# Patient Record
Sex: Female | Born: 1980 | Race: Black or African American | Hispanic: No | Marital: Married | Smoking: Never smoker
Health system: Southern US, Community
[De-identification: ages and names within clinical notes are randomized; demographics above are authoritative.]

## PROBLEM LIST (undated history)

## (undated) ENCOUNTER — Inpatient Hospital Stay (HOSPITAL_COMMUNITY): Payer: Self-pay

## (undated) DIAGNOSIS — F32A Depression, unspecified: Secondary | ICD-10-CM

## (undated) DIAGNOSIS — D649 Anemia, unspecified: Secondary | ICD-10-CM

## (undated) DIAGNOSIS — K219 Gastro-esophageal reflux disease without esophagitis: Secondary | ICD-10-CM

## (undated) DIAGNOSIS — E669 Obesity, unspecified: Secondary | ICD-10-CM

## (undated) DIAGNOSIS — R51 Headache: Secondary | ICD-10-CM

## (undated) DIAGNOSIS — F329 Major depressive disorder, single episode, unspecified: Secondary | ICD-10-CM

---
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
## 2000-04-01 ENCOUNTER — Emergency Department (HOSPITAL_COMMUNITY): Admission: EM | Admit: 2000-04-01 | Discharge: 2000-04-02 | Payer: Self-pay | Admitting: Emergency Medicine

## 2000-04-02 ENCOUNTER — Encounter: Payer: Self-pay | Admitting: Emergency Medicine

## 2000-11-17 ENCOUNTER — Observation Stay (HOSPITAL_COMMUNITY): Admission: AD | Admit: 2000-11-17 | Discharge: 2000-11-18 | Payer: Self-pay | Admitting: Obstetrics and Gynecology

## 2000-11-21 ENCOUNTER — Inpatient Hospital Stay (HOSPITAL_COMMUNITY): Admission: AD | Admit: 2000-11-21 | Discharge: 2000-11-25 | Payer: Self-pay | Admitting: Obstetrics and Gynecology

## 2001-03-08 ENCOUNTER — Encounter: Payer: Self-pay | Admitting: Obstetrics and Gynecology

## 2001-03-08 ENCOUNTER — Ambulatory Visit (HOSPITAL_COMMUNITY): Admission: RE | Admit: 2001-03-08 | Discharge: 2001-03-08 | Payer: Self-pay | Admitting: Obstetrics and Gynecology

## 2001-09-04 ENCOUNTER — Other Ambulatory Visit: Admission: RE | Admit: 2001-09-04 | Discharge: 2001-09-04 | Payer: Self-pay | Admitting: Obstetrics and Gynecology

## 2001-11-01 ENCOUNTER — Emergency Department (HOSPITAL_COMMUNITY): Admission: EM | Admit: 2001-11-01 | Discharge: 2001-11-01 | Payer: Self-pay | Admitting: Emergency Medicine

## 2002-01-28 ENCOUNTER — Emergency Department (HOSPITAL_COMMUNITY): Admission: EM | Admit: 2002-01-28 | Discharge: 2002-01-28 | Payer: Self-pay | Admitting: Emergency Medicine

## 2002-01-29 ENCOUNTER — Emergency Department (HOSPITAL_COMMUNITY): Admission: EM | Admit: 2002-01-29 | Discharge: 2002-01-29 | Payer: Self-pay | Admitting: *Deleted

## 2002-04-11 ENCOUNTER — Emergency Department (HOSPITAL_COMMUNITY): Admission: EM | Admit: 2002-04-11 | Discharge: 2002-04-11 | Payer: Self-pay | Admitting: Emergency Medicine

## 2002-08-30 ENCOUNTER — Emergency Department (HOSPITAL_COMMUNITY): Admission: EM | Admit: 2002-08-30 | Discharge: 2002-08-30 | Payer: Self-pay | Admitting: Emergency Medicine

## 2002-11-03 ENCOUNTER — Emergency Department (HOSPITAL_COMMUNITY): Admission: EM | Admit: 2002-11-03 | Discharge: 2002-11-03 | Payer: Self-pay | Admitting: Emergency Medicine

## 2002-11-27 ENCOUNTER — Emergency Department (HOSPITAL_COMMUNITY): Admission: EM | Admit: 2002-11-27 | Discharge: 2002-11-27 | Payer: Self-pay | Admitting: Emergency Medicine

## 2003-12-12 ENCOUNTER — Other Ambulatory Visit: Admission: RE | Admit: 2003-12-12 | Discharge: 2003-12-12 | Payer: Self-pay | Admitting: Obstetrics and Gynecology

## 2004-11-29 ENCOUNTER — Emergency Department (HOSPITAL_COMMUNITY): Admission: EM | Admit: 2004-11-29 | Discharge: 2004-11-29 | Payer: Self-pay | Admitting: Emergency Medicine

## 2005-05-09 ENCOUNTER — Emergency Department (HOSPITAL_COMMUNITY): Admission: EM | Admit: 2005-05-09 | Discharge: 2005-05-09 | Payer: Self-pay | Admitting: Emergency Medicine

## 2005-07-29 ENCOUNTER — Ambulatory Visit (HOSPITAL_COMMUNITY): Admission: RE | Admit: 2005-07-29 | Discharge: 2005-07-29 | Payer: Self-pay | Admitting: Obstetrics

## 2005-08-14 ENCOUNTER — Emergency Department (HOSPITAL_COMMUNITY): Admission: EM | Admit: 2005-08-14 | Discharge: 2005-08-14 | Payer: Self-pay | Admitting: Emergency Medicine

## 2005-09-28 ENCOUNTER — Emergency Department (HOSPITAL_COMMUNITY): Admission: EM | Admit: 2005-09-28 | Discharge: 2005-09-28 | Payer: Self-pay | Admitting: Emergency Medicine

## 2005-11-22 ENCOUNTER — Emergency Department (HOSPITAL_COMMUNITY): Admission: EM | Admit: 2005-11-22 | Discharge: 2005-11-22 | Payer: Self-pay | Admitting: Emergency Medicine

## 2005-12-12 ENCOUNTER — Encounter: Admission: RE | Admit: 2005-12-12 | Discharge: 2005-12-22 | Payer: Self-pay | Admitting: Obstetrics and Gynecology

## 2006-06-12 ENCOUNTER — Emergency Department (HOSPITAL_COMMUNITY): Admission: EM | Admit: 2006-06-12 | Discharge: 2006-06-12 | Payer: Self-pay | Admitting: Emergency Medicine

## 2006-08-27 ENCOUNTER — Emergency Department (HOSPITAL_COMMUNITY): Admission: EM | Admit: 2006-08-27 | Discharge: 2006-08-27 | Payer: Self-pay | Admitting: Emergency Medicine

## 2006-09-02 ENCOUNTER — Emergency Department (HOSPITAL_COMMUNITY): Admission: EM | Admit: 2006-09-02 | Discharge: 2006-09-03 | Payer: Self-pay | Admitting: Emergency Medicine

## 2006-12-05 ENCOUNTER — Emergency Department (HOSPITAL_COMMUNITY): Admission: EM | Admit: 2006-12-05 | Discharge: 2006-12-05 | Payer: Self-pay | Admitting: Emergency Medicine

## 2006-12-07 ENCOUNTER — Emergency Department (HOSPITAL_COMMUNITY): Admission: EM | Admit: 2006-12-07 | Discharge: 2006-12-07 | Payer: Self-pay | Admitting: Emergency Medicine

## 2007-03-05 ENCOUNTER — Emergency Department (HOSPITAL_COMMUNITY): Admission: EM | Admit: 2007-03-05 | Discharge: 2007-03-05 | Payer: Self-pay | Admitting: Emergency Medicine

## 2007-10-08 ENCOUNTER — Emergency Department (HOSPITAL_COMMUNITY): Admission: EM | Admit: 2007-10-08 | Discharge: 2007-10-08 | Payer: Self-pay | Admitting: Emergency Medicine

## 2007-10-12 ENCOUNTER — Emergency Department (HOSPITAL_COMMUNITY): Admission: EM | Admit: 2007-10-12 | Discharge: 2007-10-12 | Payer: Self-pay | Admitting: Emergency Medicine

## 2008-04-09 ENCOUNTER — Emergency Department (HOSPITAL_COMMUNITY): Admission: EM | Admit: 2008-04-09 | Discharge: 2008-04-09 | Payer: Self-pay | Admitting: Emergency Medicine

## 2008-04-20 ENCOUNTER — Emergency Department (HOSPITAL_COMMUNITY): Admission: EM | Admit: 2008-04-20 | Discharge: 2008-04-20 | Payer: Self-pay | Admitting: Family Medicine

## 2008-09-20 ENCOUNTER — Emergency Department (HOSPITAL_COMMUNITY): Admission: EM | Admit: 2008-09-20 | Discharge: 2008-09-20 | Payer: Self-pay | Admitting: Family Medicine

## 2008-10-30 ENCOUNTER — Emergency Department (HOSPITAL_COMMUNITY): Admission: EM | Admit: 2008-10-30 | Discharge: 2008-10-30 | Payer: Self-pay | Admitting: Family Medicine

## 2009-01-16 ENCOUNTER — Emergency Department (HOSPITAL_COMMUNITY): Admission: EM | Admit: 2009-01-16 | Discharge: 2009-01-16 | Payer: Self-pay | Admitting: Emergency Medicine

## 2009-06-03 ENCOUNTER — Emergency Department (HOSPITAL_COMMUNITY): Admission: EM | Admit: 2009-06-03 | Discharge: 2009-06-03 | Payer: Self-pay | Admitting: Family Medicine

## 2009-06-29 ENCOUNTER — Emergency Department (HOSPITAL_COMMUNITY): Admission: EM | Admit: 2009-06-29 | Discharge: 2009-06-29 | Payer: Self-pay | Admitting: Emergency Medicine

## 2010-02-09 ENCOUNTER — Emergency Department (HOSPITAL_COMMUNITY): Admission: EM | Admit: 2010-02-09 | Discharge: 2010-02-09 | Payer: Self-pay | Admitting: Emergency Medicine

## 2010-03-30 ENCOUNTER — Emergency Department (HOSPITAL_COMMUNITY): Admission: EM | Admit: 2010-03-30 | Discharge: 2010-03-30 | Payer: Self-pay | Admitting: Emergency Medicine

## 2010-10-07 LAB — DIFFERENTIAL
Lymphocytes Relative: 29 % (ref 12–46)
Lymphs Abs: 1.8 10*3/uL (ref 0.7–4.0)
Monocytes Absolute: 0.6 10*3/uL (ref 0.1–1.0)
Monocytes Relative: 9 % (ref 3–12)
Neutrophils Relative %: 55 % (ref 43–77)

## 2010-10-07 LAB — URINALYSIS, ROUTINE W REFLEX MICROSCOPIC
Glucose, UA: NEGATIVE mg/dL
Nitrite: NEGATIVE
Protein, ur: NEGATIVE mg/dL

## 2010-10-07 LAB — CBC
HCT: 37.3 % (ref 36.0–46.0)
Hemoglobin: 11.7 g/dL — ABNORMAL LOW (ref 12.0–15.0)
MCH: 25.1 pg — ABNORMAL LOW (ref 26.0–34.0)
RBC: 4.66 MIL/uL (ref 3.87–5.11)
RDW: 15.6 % — ABNORMAL HIGH (ref 11.5–15.5)

## 2010-10-07 LAB — GC/CHLAMYDIA PROBE AMP, GENITAL: Chlamydia, DNA Probe: NEGATIVE

## 2010-10-07 LAB — WET PREP, GENITAL
Trich, Wet Prep: NONE SEEN
Yeast Wet Prep HPF POC: NONE SEEN

## 2010-10-07 LAB — BASIC METABOLIC PANEL
Calcium: 8.9 mg/dL (ref 8.4–10.5)
GFR calc Af Amer: >60 mL/min (ref >60)
GFR calc non Af Amer: >60 mL/min (ref >60)
Sodium: 135 mEq/L (ref 135–145)

## 2010-10-25 ENCOUNTER — Emergency Department (HOSPITAL_COMMUNITY)
Admission: EM | Admit: 2010-10-25 | Discharge: 2010-10-25 | Disposition: A | Discharge status: Home / Self Care | Payer: Self-pay | Attending: Emergency Medicine | Admitting: Emergency Medicine

## 2010-10-25 DIAGNOSIS — B9689 Other specified bacterial agents as the cause of diseases classified elsewhere: Secondary | ICD-10-CM | POA: Insufficient documentation

## 2010-10-25 DIAGNOSIS — N760 Acute vaginitis: Secondary | ICD-10-CM | POA: Insufficient documentation

## 2010-10-25 DIAGNOSIS — K644 Residual hemorrhoidal skin tags: Secondary | ICD-10-CM | POA: Insufficient documentation

## 2010-10-25 DIAGNOSIS — A499 Bacterial infection, unspecified: Secondary | ICD-10-CM | POA: Insufficient documentation

## 2010-10-25 DIAGNOSIS — K625 Hemorrhage of anus and rectum: Secondary | ICD-10-CM | POA: Insufficient documentation

## 2010-10-25 DIAGNOSIS — R109 Unspecified abdominal pain: Secondary | ICD-10-CM | POA: Insufficient documentation

## 2010-10-25 LAB — URINALYSIS, ROUTINE W REFLEX MICROSCOPIC
Bilirubin Urine: NEGATIVE
Glucose, UA: NEGATIVE mg/dL
Hgb urine dipstick: NEGATIVE
Nitrite: NEGATIVE
Protein, ur: NEGATIVE mg/dL
Urobilinogen, UA: 0.2 mg/dL (ref 0.0–1.0)
pH: 7 (ref 5.0–8.0)

## 2010-10-25 LAB — WET PREP, GENITAL: Trich, Wet Prep: NONE SEEN

## 2010-10-25 LAB — OCCULT BLOOD, POC DEVICE: Fecal Occult Bld: POSITIVE

## 2010-10-26 ENCOUNTER — Emergency Department (HOSPITAL_COMMUNITY)
Admission: EM | Admit: 2010-10-26 | Discharge: 2010-10-26 | Disposition: A | Discharge status: Home / Self Care | Payer: Self-pay | Attending: Emergency Medicine | Admitting: Emergency Medicine

## 2010-10-26 DIAGNOSIS — R10815 Periumbilic abdominal tenderness: Secondary | ICD-10-CM | POA: Insufficient documentation

## 2010-10-26 DIAGNOSIS — R109 Unspecified abdominal pain: Secondary | ICD-10-CM | POA: Insufficient documentation

## 2010-10-26 DIAGNOSIS — R110 Nausea: Secondary | ICD-10-CM | POA: Insufficient documentation

## 2010-10-26 LAB — CBC
HCT: 38.2 % (ref 36.0–46.0)
Hemoglobin: 11.6 g/dL — ABNORMAL LOW (ref 12.0–15.0)
MCHC: 30.4 g/dL (ref 30.0–36.0)
MCV: 80.8 fL (ref 78.0–100.0)
Platelets: 331 10*3/uL (ref 150–400)
RBC: 4.73 MIL/uL (ref 3.87–5.11)
WBC: 6.5 10*3/uL (ref 4.0–10.5)

## 2010-10-26 LAB — POCT URINALYSIS DIP (DEVICE): pH: 6.5 (ref 5.0–8.0)

## 2010-10-26 LAB — COMPREHENSIVE METABOLIC PANEL
ALT: 23 U/L (ref 0–35)
AST: 18 U/L (ref 0–37)
Alkaline Phosphatase: 84 U/L (ref 39–117)
CO2: 29 mEq/L (ref 19–32)
Calcium: 9.1 mg/dL (ref 8.4–10.5)
Chloride: 104 mEq/L (ref 96–112)
GFR calc non Af Amer: >60 mL/min (ref >60)
Total Bilirubin: 0.8 mg/dL (ref 0.3–1.2)

## 2010-10-26 LAB — WET PREP, GENITAL
Clue Cells Wet Prep HPF POC: NONE SEEN
Yeast Wet Prep HPF POC: NONE SEEN

## 2010-10-26 LAB — DIFFERENTIAL
Basophils Relative: 1 % (ref 0–1)
Eosinophils Absolute: 0.4 10*3/uL (ref 0.0–0.7)
Neutrophils Relative %: 57 % (ref 43–77)

## 2010-10-26 LAB — GC/CHLAMYDIA PROBE AMP, GENITAL: GC Probe Amp, Genital: NEGATIVE

## 2010-11-01 LAB — URINALYSIS, ROUTINE W REFLEX MICROSCOPIC
Bilirubin Urine: NEGATIVE
Ketones, ur: NEGATIVE mg/dL
Nitrite: NEGATIVE
Protein, ur: NEGATIVE mg/dL
pH: 6.5 (ref 5.0–8.0)

## 2010-11-01 LAB — DIFFERENTIAL
Basophils Absolute: 0.1 10*3/uL (ref 0.0–0.1)
Eosinophils Absolute: 0.4 10*3/uL (ref 0.0–0.7)
Eosinophils Relative: 7 % — ABNORMAL HIGH (ref 0–5)
Lymphocytes Relative: 23 % (ref 12–46)
Monocytes Absolute: 0.5 10*3/uL (ref 0.1–1.0)

## 2010-11-01 LAB — COMPREHENSIVE METABOLIC PANEL
Albumin: 3.6 g/dL (ref 3.5–5.2)
Alkaline Phosphatase: 64 U/L (ref 39–117)
CO2: 24 mEq/L (ref 19–32)
Calcium: 8.8 mg/dL (ref 8.4–10.5)
GFR calc Af Amer: >60 mL/min (ref >60)
Glucose, Bld: 100 mg/dL — ABNORMAL HIGH (ref 70–99)
Total Protein: 7.5 g/dL (ref 6.0–8.3)

## 2010-11-01 LAB — CBC
MCHC: 33 g/dL (ref 30.0–36.0)
MCV: 78.3 fL (ref 78.0–100.0)

## 2010-11-03 LAB — POCT URINALYSIS DIP (DEVICE)
Bilirubin Urine: NEGATIVE
Glucose, UA: NEGATIVE mg/dL
Hgb urine dipstick: NEGATIVE
Specific Gravity, Urine: 1.02 (ref 1.005–1.030)
Urobilinogen, UA: 0.2 mg/dL (ref 0.0–1.0)
pH: 6.5 (ref 5.0–8.0)

## 2010-11-03 LAB — GC/CHLAMYDIA PROBE AMP, GENITAL
Chlamydia, DNA Probe: NEGATIVE
GC Probe Amp, Genital: NEGATIVE

## 2010-11-03 LAB — WET PREP, GENITAL: Trich, Wet Prep: NONE SEEN

## 2010-11-09 LAB — POCT URINALYSIS DIP (DEVICE)
Nitrite: NEGATIVE
Protein, ur: 30 mg/dL — AB
pH: 6.5 (ref 5.0–8.0)

## 2011-03-09 ENCOUNTER — Emergency Department (HOSPITAL_COMMUNITY): Payer: Self-pay

## 2011-03-09 ENCOUNTER — Emergency Department (HOSPITAL_COMMUNITY)
Admission: EM | Admit: 2011-03-09 | Discharge: 2011-03-09 | Disposition: A | Discharge status: Home / Self Care | Payer: Self-pay | Attending: Emergency Medicine | Admitting: Emergency Medicine

## 2011-03-09 DIAGNOSIS — R10814 Left lower quadrant abdominal tenderness: Secondary | ICD-10-CM | POA: Insufficient documentation

## 2011-03-09 DIAGNOSIS — R109 Unspecified abdominal pain: Secondary | ICD-10-CM | POA: Insufficient documentation

## 2011-03-09 DIAGNOSIS — N949 Unspecified condition associated with female genital organs and menstrual cycle: Secondary | ICD-10-CM | POA: Insufficient documentation

## 2011-03-09 LAB — WET PREP, GENITAL
Trich, Wet Prep: NONE SEEN
Yeast Wet Prep HPF POC: NONE SEEN

## 2011-03-09 LAB — URINALYSIS, ROUTINE W REFLEX MICROSCOPIC
Bilirubin Urine: NEGATIVE
Ketones, ur: NEGATIVE mg/dL
Nitrite: NEGATIVE
Urobilinogen, UA: 0.2 mg/dL (ref 0.0–1.0)

## 2011-03-09 LAB — POCT PREGNANCY, URINE: Preg Test, Ur: NEGATIVE

## 2011-05-02 ENCOUNTER — Inpatient Hospital Stay (INDEPENDENT_AMBULATORY_CARE_PROVIDER_SITE_OTHER)
Admission: RE | Admit: 2011-05-02 | Discharge: 2011-05-02 | Disposition: A | Discharge status: Home / Self Care | Payer: Self-pay | Source: Ambulatory Visit | Attending: Family Medicine | Admitting: Family Medicine

## 2011-05-02 DIAGNOSIS — K089 Disorder of teeth and supporting structures, unspecified: Secondary | ICD-10-CM

## 2011-09-14 ENCOUNTER — Emergency Department (HOSPITAL_COMMUNITY)
Admission: EM | Admit: 2011-09-14 | Discharge: 2011-09-14 | Disposition: A | Discharge status: Home / Self Care | Payer: BC Managed Care – PPO | Source: Home / Self Care

## 2011-09-14 ENCOUNTER — Encounter (HOSPITAL_COMMUNITY): Payer: Self-pay | Admitting: *Deleted

## 2011-09-14 DIAGNOSIS — B9789 Other viral agents as the cause of diseases classified elsewhere: Secondary | ICD-10-CM

## 2011-09-14 DIAGNOSIS — J028 Acute pharyngitis due to other specified organisms: Secondary | ICD-10-CM

## 2011-09-14 NOTE — ED Provider Notes (Signed)
Medical screening examination/treatment/procedure(s) were performed by resident physician and as supervising physician I was immediately available for consultation/collaboration.  Richardo Priest, MD  Richardo Priest, MD 09/14/11 2215

## 2011-09-14 NOTE — ED Notes (Signed)
Pt  Reports  Symptoms  Of  sorethroat  As  Well  As  Cough/  Congestion  With symptoms  Onset   Yesterday  Am

## 2011-09-14 NOTE — Discharge Instructions (Signed)
Try the Chloraseptic spray for relief.  Throat lozenges will also help.    Sore Throat A sore throat is felt inside the throat and at the back of the mouth. It hurts to swallow or the throat may feel dry and scratchy. It can be caused by germs, smoking, pollution, or allergies.  HOME CARE   Only take medicine as told by your doctor.   Drink enough fluids to keep your pee (urine) clear or pale yellow.   Eat soft foods.   Do not smoke.   Rinse the mouth (gargle) with warm water or salt water ( teaspoon salt in 8 ounces of water).   Try throat sprays, lozenges, or suck on hard candy.  GET HELP RIGHT AWAY IF:   You have trouble breathing.   Your sore throat lasts longer than 1 week.   There is more puffiness (swelling) in the throat.   The pain is so bad that you are unable to swallow.   You have a very bad headache or a red rash.   You start to throw up (vomit).   You or your child has a temperature by mouth above 102 F (38.9 C), not controlled by medicine.   Your baby is older than 3 months with a rectal temperature of 102 F (38.9 C) or higher.   Your baby is 44 months old or younger with a rectal temperature of 100.4 F (38 C) or higher.  MAKE SURE YOU:   Understand these instructions.   Will watch your condition.   Will get help right away if you are not doing well or get worse.  Document Released: 04/19/2008 Document Revised: 03/23/2011 Document Reviewed: 04/19/2008 [facility] Patient Information 2012 [place], [place].

## 2011-09-14 NOTE — ED Provider Notes (Signed)
Brooke Chan is a 31 y.o. female who presents to Urgent Care today for sore throat of one day duration. She has not had any preceding symptoms. She has several sick contacts being a sister, niece, her son lives at home on the viral URI symptoms for the past week or so. She describes a scratchy throat worse with swallowing. She is able to eating and drink well. She has not tried anything but poorly. She states she woke up yesterday with sore throat. Since then she has had some minor chills but no fevers. Some nasal congestion and nasal drainage currently. Mild cough as well.   PMH reviewed.  ROS as above otherwise neg Medications reviewed. No current facility-administered medications for this encounter.   No current outpatient prescriptions on file.    Exam:  BP 142/86  Pulse 78  Temp(Src) 98.6 F (37 C) (Oral)  Resp 18  SpO2 100%  LMP 09/14/2011 Gen: Well NAD HEENT: EOMI,  MMM. Ears clear bilaterally with clear tympanic membranes. Nasal turbinates mildly enlarged with some exudate. Tonsils +1 with some erythema of the uvula and soft palate. No exudates noted. Neck: No cervical lymphadenopathy. Trachea midline without thyromegaly. Lungs: CTABL Nl WOB Heart: RRR no MRG Abd: NABS, NT, ND Exts: Non edematous BL  LE, warm and well perfused.   Assessment and Plan: 1. Sore throat: Likely viral URI especially with several sick contacts. Recommended symptomatic treatment including Chloraseptic spray and throat lozenges. Patient is eating and drinking well. She is currently talking well without hoarseness. Handling secretions well. No red flags based on history or physical. Followup in one week if no improvement with PCP or here. Followup sooner if worsening of conditions.    Renold Don, MD 09/14/11 [phone]

## 2011-09-17 ENCOUNTER — Emergency Department (INDEPENDENT_AMBULATORY_CARE_PROVIDER_SITE_OTHER)
Admission: EM | Admit: 2011-09-17 | Discharge: 2011-09-17 | Disposition: A | Discharge status: Home / Self Care | Payer: BC Managed Care – PPO | Source: Home / Self Care | Attending: Family Medicine | Admitting: Family Medicine

## 2011-09-17 ENCOUNTER — Encounter (HOSPITAL_COMMUNITY): Payer: Self-pay | Admitting: *Deleted

## 2011-09-17 DIAGNOSIS — J029 Acute pharyngitis, unspecified: Secondary | ICD-10-CM

## 2011-09-17 DIAGNOSIS — J069 Acute upper respiratory infection, unspecified: Secondary | ICD-10-CM

## 2011-09-17 MED ORDER — GENTAMICIN SULFATE 0.3 % OP SOLN: 2.0000 [drp] | Freq: Three times a day (TID) | OPHTHALMIC | Status: AC

## 2011-09-17 MED ORDER — AZITHROMYCIN 250 MG PO TABS: 250.0000 mg | ORAL_TABLET | Freq: Every day | ORAL | Status: AC

## 2011-09-17 MED ORDER — GUAIFENESIN-CODEINE 100-10 MG/5ML PO SYRP: 5.0000 mL | ORAL_SOLUTION | Freq: Four times a day (QID) | ORAL | Status: AC | PRN

## 2011-09-17 NOTE — ED Provider Notes (Signed)
History     CSN: [id number]  Arrival date & time 09/17/11  0921   First MD Initiated Contact with Patient 09/17/11 [phone]      Chief Complaint  Patient presents with  . Cough  . Nasal Congestion  . Sore Throat  . Conjunctivitis    (Consider location/radiation/quality/duration/timing/severity/associated sxs/prior treatment) HPI Comments: THE PATIENT REPORTS ONSET Monday WITH SORE THROAT, COUGH, RUNNY NOSE, AND CONGESTION. THINKS SHE HAD FEVER BUT DID NOT MEASURE. TAKING MUCINEX AND MOTRIN. NO VOMITING OR DIARRHEA.  The history is provided by the patient.    History reviewed. No pertinent past medical history.  Past Surgical History  Procedure Date  . Cesarean section     History reviewed. No pertinent family history.  History  Substance Use Topics  . Smoking status: Never Smoker   . Smokeless tobacco: Not on file  . Alcohol Use: No    OB History    Grav Para Term Preterm Abortions TAB SAB Ect Mult Living                  Review of Systems  Constitutional: Positive for fever.  HENT: Positive for congestion, sore throat, rhinorrhea and postnasal drip. Negative for ear pain and neck pain.   Respiratory: Positive for cough.   Cardiovascular: Negative.   Gastrointestinal: Negative.   Genitourinary: Negative.   Neurological: Positive for headaches.    Allergies  Penicillins and Shellfish allergy  Home Medications   Current Outpatient Rx  Name Route Sig Dispense Refill  . GUAIFENESIN ER 600 MG PO TB12 Oral Take 1,200 mg by mouth 2 (two) times daily.    Marland Kitchen HYDROCODONE-ACETAMINOPHEN 5-325 MG PO TABS Oral Take 0.5 tablets by mouth every 6 (six) hours as needed.    . IBUPROFEN 600 MG PO TABS Oral Take 600 mg by mouth every 6 (six) hours as needed.    Marland Kitchen OVER THE COUNTER MEDICATION  cloroseptic spray    . AZITHROMYCIN 250 MG PO TABS Oral Take 1 tablet (250 mg total) by mouth daily. Take first 2 tablets together, then 1 every day until finished. 6 tablet 0  .  GUAIFENESIN-CODEINE 100-10 MG/5ML PO SYRP Oral Take 5 mLs by mouth every 6 (six) hours as needed for cough. 120 mL 0    BP 132/88  Pulse 108  Temp(Src) 97.6 F (36.4 C) (Oral)  Resp 19  SpO2 99%  LMP 09/12/2011  Physical Exam  Nursing note and vitals reviewed. Constitutional: She appears well-developed and well-nourished. No distress.  HENT:  Head: Atraumatic.       EARS CLAR, NOSE CONGESTED, THROAT RED AND SWOLLEN,   Cardiovascular: Normal rate and regular rhythm.   Pulmonary/Chest: Effort normal and breath sounds normal.  Lymphadenopathy:    She has cervical adenopathy.  Skin: Skin is warm and dry.    ED Course  Procedures (including critical care time)  Labs Reviewed - No data to display No results found.   1. URI (upper respiratory infection)   2. Pharyngitis       MDM          Randa Spike, MD 09/17/11 1056

## 2011-09-17 NOTE — ED Notes (Signed)
md back to assess pt left eye - eye drops ordered prescription given to pt

## 2011-09-17 NOTE — ED Notes (Signed)
Pt with onset of sorethroat/cough/congestion/sneezing and left eye red/irritated - coughing more at night unable to breathe through nose at night

## 2011-09-17 NOTE — Discharge Instructions (Signed)
TYLENOL OR MOTRIN AS NEEDED. AVOID CAFFEINE AND MILK PRODUCTS. THROAT SPRAY OF CHOICE. FOLLOW UP WITH YOUR PCP OR RETURN IF SYMPTOMS PERSIST OR WORSEN.

## 2012-03-06 ENCOUNTER — Other Ambulatory Visit: Payer: Self-pay | Admitting: Obstetrics

## 2012-03-29 ENCOUNTER — Encounter (HOSPITAL_COMMUNITY): Payer: Self-pay | Admitting: Emergency Medicine

## 2012-03-29 ENCOUNTER — Emergency Department (INDEPENDENT_AMBULATORY_CARE_PROVIDER_SITE_OTHER)
Admission: EM | Admit: 2012-03-29 | Discharge: 2012-03-29 | Disposition: A | Discharge status: Home / Self Care | Payer: Self-pay | Source: Home / Self Care | Attending: Emergency Medicine | Admitting: Emergency Medicine

## 2012-03-29 DIAGNOSIS — N83209 Unspecified ovarian cyst, unspecified side: Secondary | ICD-10-CM

## 2012-03-29 DIAGNOSIS — A499 Bacterial infection, unspecified: Secondary | ICD-10-CM

## 2012-03-29 DIAGNOSIS — N760 Acute vaginitis: Secondary | ICD-10-CM

## 2012-03-29 DIAGNOSIS — B9689 Other specified bacterial agents as the cause of diseases classified elsewhere: Secondary | ICD-10-CM

## 2012-03-29 LAB — POCT URINALYSIS DIP (DEVICE)
Glucose, UA: NEGATIVE mg/dL
Hgb urine dipstick: NEGATIVE
Ketones, ur: NEGATIVE mg/dL
Protein, ur: NEGATIVE mg/dL
Specific Gravity, Urine: >=1.03 (ref 1.005–1.030)

## 2012-03-29 LAB — WET PREP, GENITAL
WBC, Wet Prep HPF POC: NONE SEEN
Yeast Wet Prep HPF POC: NONE SEEN

## 2012-03-29 LAB — POCT PREGNANCY, URINE: Preg Test, Ur: NEGATIVE

## 2012-03-29 LAB — CBC WITH DIFFERENTIAL/PLATELET
Eosinophils Relative: 6 % — ABNORMAL HIGH (ref 0–5)
HCT: 37.3 % (ref 36.0–46.0)
Hemoglobin: 11.8 g/dL — ABNORMAL LOW (ref 12.0–15.0)
Lymphocytes Relative: 35 % (ref 12–46)
MCV: 78.9 fL (ref 78.0–100.0)
Monocytes Absolute: 0.8 10*3/uL (ref 0.1–1.0)
Monocytes Relative: 9 % (ref 3–12)
Neutro Abs: 4.4 10*3/uL (ref 1.7–7.7)
WBC: 9 10*3/uL (ref 4.0–10.5)

## 2012-03-29 MED ORDER — TRAMADOL HCL 50 MG PO TABS: 100.0000 mg | ORAL_TABLET | Freq: Three times a day (TID) | ORAL | Status: AC | PRN

## 2012-03-29 MED ORDER — METRONIDAZOLE 500 MG PO TABS: 500.0000 mg | ORAL_TABLET | Freq: Two times a day (BID) | ORAL | Status: AC

## 2012-03-29 MED ORDER — DICLOFENAC SODIUM 75 MG PO TBEC: 75.0000 mg | DELAYED_RELEASE_TABLET | Freq: Two times a day (BID) | ORAL | Status: DC

## 2012-03-29 NOTE — ED Provider Notes (Signed)
Chief Complaint  Patient presents with  . Abdominal Pain    History of Present Illness:    The patient is a 31 year old female who has had a three-day history of right lower quadrant abdominal pain radiating through to the back. It feels like a cramping pain and is rated 5/10 at its worse and is now a 3-4/10 in intensity. The pain is worse with walking and better with rest and ibuprofen. She's had slight nausea but no vomiting. She denies fever or chills. No constipation, diarrhea, or blood in the stool. She denies urinary symptoms. She has had a slight, thin vaginal discharge with some vaginal odor after intercourse and some vaginal irritation but no itching. She tried Diflucan this did not help at all. Her last menstrual period was August 11. She is sexually active and is not using any birth control or condoms. She has a history of an ovarian cyst in the distant past but none recently.  Review of Systems:  Other than noted above, the patient denies any of the following symptoms: Constitutional:  No fever, chills, fatigue, weight loss or anorexia. Lungs:  No cough or shortness of breath. Heart:  No chest pain, palpitations, syncope or edema.  No cardiac history. Abdomen:  No nausea, vomiting, hematememesis, melena, diarrhea, or hematochezia. GU:  No dysuria, frequency, urgency, or hematuria. Gyn:  No vaginal discharge, itching, abnormal bleeding, dyspareunia, or pelvic pain.  PMFSH:  Past medical history, family history, social history, meds, and allergies were reviewed along with nurse's notes.  No prior abdominal surgeries, past history of GI problems, STDs or GYN problems.  No history of aspirin or NSAID use.  No excessive alcohol intake.  Physical Exam:   Vital signs:  BP 111/73  Pulse 104  Temp 98.6 F (37 C) (Oral)  Resp 18  SpO2 100%  LMP 03/13/2012 Gen:  Alert, oriented, in no distress. Lungs:  Breath sounds clear and equal bilaterally.  No wheezes, rales or rhonchi. Heart:   Regular rhythm.  No gallops or murmers.   Abdomen:  Soft, flat, nondistended. She has pain to palpation mostly in the suprapubic area but to some extent also in the right lower quadrant and the epigastrium. No guarding or rebound. No organomegaly or mass. Bowel sounds are normally active. Pelvic:  Normal external genitalia. Vaginal and cervical mucosa was normal. She has a scant, thin, malodorous discharge. Cervix appears normal. She has minimal pain on cervical motion. Uterus is posterior and appears normal in size and is slightly tender to palpation. No adnexal masses. She has mild pain to palpation on both the right and the left. Skin:  Clear, warm and dry.  No rash.    Labs:   Results for orders placed during the hospital encounter of 03/29/12  WET PREP, GENITAL      Component Value Range   Yeast Wet Prep HPF POC NONE SEEN  NONE SEEN   Trich, Wet Prep NONE SEEN  NONE SEEN   Clue Cells Wet Prep HPF POC MODERATE (*) NONE SEEN   WBC, Wet Prep HPF POC NONE SEEN  NONE SEEN  POCT URINALYSIS DIP (DEVICE)      Component Value Range   Glucose, UA NEGATIVE  NEGATIVE mg/dL   Bilirubin Urine NEGATIVE  NEGATIVE   Ketones, ur NEGATIVE  NEGATIVE mg/dL   Specific Gravity, Urine >=1.030  1.005 - 1.030   Hgb urine dipstick NEGATIVE  NEGATIVE   pH 6.5  5.0 - 8.0   Protein, ur NEGATIVE  NEGATIVE mg/dL   Urobilinogen, UA 0.2  0.0 - 1.0 mg/dL   Nitrite NEGATIVE  NEGATIVE   Leukocytes, UA NEGATIVE  NEGATIVE  POCT PREGNANCY, URINE      Component Value Range   Preg Test, Ur NEGATIVE  NEGATIVE  CBC WITH DIFFERENTIAL      Component Value Range   WBC 9.0  4.0 - 10.5 K/uL   RBC 4.73  3.87 - 5.11 MIL/uL   Hemoglobin 11.8 (*) 12.0 - 15.0 g/dL   HCT 16.1  09.6 - 04.5 %   MCV 78.9  78.0 - 100.0 fL   MCH 24.9 (*) 26.0 - 34.0 pg   MCHC 31.6  30.0 - 36.0 g/dL   RDW 40.9 (*) 81.1 - 91.4 %   Platelets 333  150 - 400 K/uL   Neutrophils Relative 49  43 - 77 %   Neutro Abs 4.4  1.7 - 7.7 K/uL   Lymphocytes  Relative 35  12 - 46 %   Lymphs Abs 3.2  0.7 - 4.0 K/uL   Monocytes Relative 9  3 - 12 %   Monocytes Absolute 0.8  0.1 - 1.0 K/uL   Eosinophils Relative 6 (*) 0 - 5 %   Eosinophils Absolute 0.5  0.0 - 0.7 K/uL   Basophils Relative 1  0 - 1 %   Basophils Absolute 0.1  0.0 - 0.1 K/uL    Assessment:  The primary encounter diagnosis was Ovarian cyst. A diagnosis of Bacterial vaginosis was also pertinent to this visit.  Differential diagnosis includes ovarian cyst, PID, or fibroid tumor. I think she will need to have an ultrasound to make a certain diagnosis. In the meantime will treat with tramadol and diclofenac. She was referred to see Dr. Normand Sloop for further evaluation.  Plan:   1.  The following meds were prescribed:   New Prescriptions   DICLOFENAC (VOLTAREN) 75 MG EC TABLET    Take 1 tablet (75 mg total) by mouth 2 (two) times daily.   METRONIDAZOLE (FLAGYL) 500 MG TABLET    Take 1 tablet (500 mg total) by mouth 2 (two) times daily.   TRAMADOL (ULTRAM) 50 MG TABLET    Take 2 tablets (100 mg total) by mouth every 8 (eight) hours as needed for pain.   2.  The patient was instructed in symptomatic care and handouts were given. 3.  The patient was told to return if becoming worse in any way, if no better in 3 or 4 days, and given some red flag symptoms that would indicate earlier return.  Follow up:  The patient was told to follow up with Dr. Normand Sloop within the next week. Patient was told to go to the emergency room if she should develop any worsening symptoms, with fever or vomiting.     Reuben Likes, MD 03/29/12 2124

## 2012-03-29 NOTE — ED Notes (Signed)
Pt having crampy pain in RLQ for 3 days. She says there is an odor after intercourse and she has had irritation and increased watery discharge. Pt denies dysuria or frequency. Pt tried fluconazole that was ordered by phone by her PCP, but it does not seem to have helped after 2 days.

## 2012-03-30 LAB — GC/CHLAMYDIA PROBE AMP, GENITAL: Chlamydia, DNA Probe: NEGATIVE

## 2012-03-30 NOTE — ED Notes (Signed)
GC/Chlamydia neg., Wet prep: mod. clue cells.  Pt. adequately treated with Flagyl. Vassie Moselle 03/30/2012

## 2012-08-06 ENCOUNTER — Encounter (HOSPITAL_COMMUNITY): Payer: Self-pay | Admitting: *Deleted

## 2012-08-06 ENCOUNTER — Emergency Department (HOSPITAL_COMMUNITY)
Admission: EM | Admit: 2012-08-06 | Discharge: 2012-08-06 | Disposition: A | Discharge status: Home / Self Care | Payer: 59 | Attending: Emergency Medicine | Admitting: Emergency Medicine

## 2012-08-06 DIAGNOSIS — L03019 Cellulitis of unspecified finger: Secondary | ICD-10-CM

## 2012-08-06 MED ORDER — SULFAMETHOXAZOLE-TRIMETHOPRIM 800-160 MG PO TABS: 1.0000 | ORAL_TABLET | Freq: Two times a day (BID) | ORAL | Status: DC

## 2012-08-06 MED ORDER — LIDOCAINE HCL (PF) 1 % IJ SOLN
5.0000 mL | Freq: Once | INTRAMUSCULAR | Status: AC
  Administered 2012-08-06: 5 mL
  Filled 2012-08-06: qty 5

## 2012-08-06 NOTE — ED Provider Notes (Signed)
History     CSN: [id number]  Arrival date & time 08/06/12  1643   First MD Initiated Contact with Patient 08/06/12 1715      Chief Complaint  Patient presents with  . Hand Pain    (Consider location/radiation/quality/duration/timing/severity/associated sxs/prior treatment) HPI Comments: Or patient complains of redness, pain, and swelling around the cuticle of her left middle finger began several days ago. Patient does report biting her fingernails  She denies numbness, weakness, or proximal tenderness   Patient is a 32 y.o. female presenting with hand pain. The history is provided by the patient.  Hand Pain This is a new problem. The current episode started in the past 7 days. The problem occurs constantly. The problem has been gradually worsening. Associated symptoms include arthralgias. Pertinent negatives include no chills, fever, joint swelling, numbness, rash or weakness. Exacerbated by: Movement and palpation. She has tried nothing for the symptoms. The treatment provided no relief.    History reviewed. No pertinent past medical history.  Past Surgical History  Procedure Date  . Cesarean section     No family history on file.  History  Substance Use Topics  . Smoking status: Never Smoker   . Smokeless tobacco: Not on file  . Alcohol Use: No    OB History    Grav Para Term Preterm Abortions TAB SAB Ect Mult Living                  Review of Systems  Constitutional: Negative for fever and chills.  Genitourinary: Negative for dysuria and difficulty urinating.  Musculoskeletal: Positive for arthralgias. Negative for joint swelling.  Skin: Positive for color change and wound. Negative for rash.  Neurological: Negative for weakness and numbness.  All other systems reviewed and are negative.    Allergies  Shellfish allergy and Penicillins  Home Medications  No current outpatient prescriptions on file.  BP 122/83  Pulse 105  Temp 98.2 F (36.8 C) (Oral)   Resp 16  Ht 5' 6.5" (1.689 m)  Wt 284 lb (128.822 kg)  BMI 45.15 kg/m2  SpO2 98%  LMP 08/06/2012  Physical Exam  Nursing note and vitals reviewed. Constitutional: She is oriented to person, place, and time. She appears well-developed and well-nourished. No distress.  HENT:  Head: Normocephalic and atraumatic.  Cardiovascular: Normal rate, regular rhythm and normal heart sounds.   Pulmonary/Chest: Effort normal and breath sounds normal.  Musculoskeletal: She exhibits tenderness. She exhibits no edema.       Left hand: She exhibits tenderness and swelling. She exhibits normal range of motion, no bony tenderness, normal two-point discrimination, normal capillary refill, no deformity and no laceration. normal sensation noted. Normal strength noted.       Hands:      Localized erythema and mild soft tissue swelling around the cuticle of the left ring finger. No lymphangitis.  Radial pulse is brisk, sensation intact.  CR< 2 sec.    Patient has full ROM.  Neurological: She is alert and oriented to person, place, and time. She exhibits normal muscle tone. Coordination normal.  Skin: Skin is warm and dry.    ED Course  Drain paronychia Date/Time: 08/06/2012 6:03 PM Performed by: Trisha Mangle, Cecilio Ohlrich L. Authorized by: Maxwell Caul Consent: Verbal consent obtained. Written consent not obtained. Risks and benefits: risks, benefits and alternatives were discussed Consent given by: patient Patient understanding: patient states understanding of the procedure being performed Patient identity confirmed: verbally with patient Time out: Immediately prior to procedure  a "time out" was called to verify the correct patient, procedure, equipment, support staff and site/side marked as required. Preparation: Patient was prepped and draped in the usual sterile fashion. Local anesthesia used: digital block performed using 1% plain lidocaine. Patient sedated: no Patient tolerance: Patient tolerated the  procedure well with no immediate complications.   (including critical care time)  Labs Reviewed - No data to display No results found.      MDM    Patient agrees to elevate the finger , keep it bandaged, warm water soaks and followup with her PCP if needed   Prescribed: Bactrim   Joenathan Sakuma L. [place], [place] 08/06/12 1809

## 2012-08-06 NOTE — ED Notes (Signed)
Pain, swelling and redness to left 3rd digit. First noticed "puffiness" last night.

## 2012-08-19 NOTE — ED Provider Notes (Signed)
Medical screening examination/treatment/procedure(s) were performed by non-physician practitioner and as supervising physician I was immediately available for consultation/collaboration.   Raniya Golembeski L Harli Engelken, MD 08/19/12 1523 

## 2012-11-25 ENCOUNTER — Emergency Department (HOSPITAL_COMMUNITY)
Admission: EM | Admit: 2012-11-25 | Discharge: 2012-11-25 | Disposition: A | Discharge status: Home / Self Care | Payer: 59 | Attending: Emergency Medicine | Admitting: Emergency Medicine

## 2012-11-25 ENCOUNTER — Encounter (HOSPITAL_COMMUNITY): Payer: Self-pay | Admitting: Family Medicine

## 2012-11-25 DIAGNOSIS — Z3490 Encounter for supervision of normal pregnancy, unspecified, unspecified trimester: Secondary | ICD-10-CM

## 2012-11-25 DIAGNOSIS — R110 Nausea: Secondary | ICD-10-CM

## 2012-11-25 DIAGNOSIS — O9989 Other specified diseases and conditions complicating pregnancy, childbirth and the puerperium: Secondary | ICD-10-CM | POA: Insufficient documentation

## 2012-11-25 LAB — URINALYSIS, ROUTINE W REFLEX MICROSCOPIC
Bilirubin Urine: NEGATIVE
Glucose, UA: NEGATIVE mg/dL
Hgb urine dipstick: NEGATIVE
Specific Gravity, Urine: 1.027 (ref 1.005–1.030)
pH: 7.5 (ref 5.0–8.0)

## 2012-11-25 MED ORDER — ONDANSETRON 4 MG PO TBDP: 4.0000 mg | ORAL_TABLET | Freq: Three times a day (TID) | ORAL | Status: DC | PRN

## 2012-11-25 MED ORDER — ONDANSETRON 4 MG PO TBDP
4.0000 mg | ORAL_TABLET | Freq: Once | ORAL | Status: AC
  Administered 2012-11-25: 4 mg via ORAL
  Filled 2012-11-25: qty 1

## 2012-11-25 NOTE — ED Provider Notes (Signed)
Medical screening examination/treatment/procedure(s) were performed by non-physician practitioner and as supervising physician I was immediately available for consultation/collaboration.  Yanelli Zapanta R. Maria Coin, MD 11/25/12 1957 

## 2012-11-25 NOTE — ED Notes (Signed)
Pt c/o nausea in the morning for the past couple mornings, sts she usually doesn't vomit but has once or twice when she is brushing her teeth. Pt reports she had some spotting a few weeks ago but hasn't seen anything lately. Pt in nad, skin warm and dry, resp e/u, ambulatory.

## 2012-11-25 NOTE — ED Notes (Signed)
Pt given something to drink and eat.   

## 2012-11-25 NOTE — ED Provider Notes (Signed)
History     CSN: [id number]  Arrival date & time 11/25/12  1041   First MD Initiated Contact with Patient 11/25/12 1111      Chief Complaint  Patient presents with  . Morning Sickness    (Consider location/radiation/quality/duration/timing/severity/associated sxs/prior treatment) HPI Comments: Pt state that she has had nausea without vomiting for the last couple of weeks:pt states that she had a positive pregnancy test at home last week:pt is scheduled to see an ob in three days:pt states that her lmp was around 2/15:pt states that this is her second pregnancy she has [facility] [employer] child:pt denies abdominal pain:pt states that she she bleed for a couple of minutes about 1 week ago but the symptoms resolved:pt denies dysuria and discharge  The history is provided by the patient. No language interpreter was used.    History reviewed. No pertinent past medical history.  Past Surgical History  Procedure Laterality Date  . Cesarean section      History reviewed. No pertinent family history.  History  Substance Use Topics  . Smoking status: Never Smoker   . Smokeless tobacco: Not on file  . Alcohol Use: No    OB History   Grav Para Term Preterm Abortions TAB SAB Ect Mult Living   1               Review of Systems  Constitutional: Negative.   Respiratory: Negative.   Cardiovascular: Negative.     Allergies  Shellfish allergy and Penicillins  Home Medications  No current outpatient prescriptions on file.  BP 122/71  Pulse 109  Temp(Src) 97.4 F (36.3 C)  Resp 18  SpO2 100%  LMP 09/08/2012  Physical Exam  Nursing note and vitals reviewed. Constitutional: She is oriented to person, place, and time. She appears well-developed and well-nourished.  HENT:  Head: Normocephalic and atraumatic.  Eyes: Conjunctivae and EOM are normal.  Neck: Normal range of motion. Neck supple.  Cardiovascular: Normal rate and regular rhythm.   Pulmonary/Chest: Effort normal and breath  sounds normal.  Abdominal: Soft. Bowel sounds are normal. There is no tenderness.  Musculoskeletal: Normal range of motion.  Neurological: She is alert and oriented to person, place, and time. Coordination normal.  Skin: Skin is warm and dry.  Psychiatric: She has a normal mood and affect.    ED Course  Procedures (including critical care time)  Labs Reviewed  POCT PREGNANCY, URINE - Abnormal; Notable for the following:    Preg Test, Ur POSITIVE (*)    All other components within normal limits  URINALYSIS, ROUTINE W REFLEX MICROSCOPIC   No results found.   1. Pregnancy   2. Nausea       MDM  Pt is feeling better with the zofran will send home on:pt is not having any abdominal pain or bleeding:pt is to follow up with ob in 3 days:pt instructed on use of prenatal vitamins       Teressa Lower, NP 11/25/12 1235

## 2012-11-25 NOTE — ED Notes (Signed)
Per pt sts nausea for about 1 week with 3 positive pregnancy test at home. sts she has a doctor appt Wednesday but needs something for nausea to get her through. sts some spotting last week but it has stopped.

## 2012-12-08 ENCOUNTER — Inpatient Hospital Stay (HOSPITAL_COMMUNITY)
Admission: AD | Admit: 2012-12-08 | Discharge: 2012-12-08 | Disposition: A | Discharge status: Home / Self Care | Payer: 59 | Source: Ambulatory Visit | Attending: Obstetrics & Gynecology | Admitting: Obstetrics & Gynecology

## 2012-12-08 ENCOUNTER — Encounter (HOSPITAL_COMMUNITY): Payer: Self-pay

## 2012-12-08 DIAGNOSIS — E669 Obesity, unspecified: Secondary | ICD-10-CM | POA: Insufficient documentation

## 2012-12-08 DIAGNOSIS — Y92009 Unspecified place in unspecified non-institutional (private) residence as the place of occurrence of the external cause: Secondary | ICD-10-CM | POA: Insufficient documentation

## 2012-12-08 DIAGNOSIS — O99891 Other specified diseases and conditions complicating pregnancy: Secondary | ICD-10-CM | POA: Insufficient documentation

## 2012-12-08 DIAGNOSIS — T148XXA Other injury of unspecified body region, initial encounter: Secondary | ICD-10-CM

## 2012-12-08 DIAGNOSIS — W010XXA Fall on same level from slipping, tripping and stumbling without subsequent striking against object, initial encounter: Secondary | ICD-10-CM | POA: Insufficient documentation

## 2012-12-08 DIAGNOSIS — W19XXXA Unspecified fall, initial encounter: Secondary | ICD-10-CM

## 2012-12-08 MED ORDER — ACETAMINOPHEN 500 MG PO TABS
1000.0000 mg | ORAL_TABLET | Freq: Once | ORAL | Status: AC
  Administered 2012-12-08: 1000 mg via ORAL
  Filled 2012-12-08: qty 2

## 2012-12-08 NOTE — MAU Provider Note (Signed)
Chief Complaint: Fall   First Provider Initiated Contact with Patient 12/08/12 1744     SUBJECTIVE HPI: Brooke Chan is a 32 y.o. G2P1001 at [redacted]w[redacted]d by LMP who presents approximately 3 hours after falling forward on carpeted steps landing on her knees and sides. She's now having discomfort in her left side ( indicates groin to waist level at MAL) that she describes as sharp intermittent shooting pains which are occurring at rest or with movement. She tried one ES Tylenol with some relief 3 hrs ago. Has had subjective symptoms of pregnancy for the last 1-2  weeks and had first positive HPT in  April. No VB or hematuria.  No back pain.  Expects to start prenatal care with Dr. Tamela Oddi next week.  No past medical history on file. OB History   Grav Para Term Preterm Abortions TAB SAB Ect Mult Living   2 1 1  0 0 0 0 0 0 1     # Outc Date GA Lbr Len/2nd Wgt Sex Del Anes PTL Lv   1 TRM 5/02    M LTCS Spinal  Yes   2 CUR              Past Surgical History  Procedure Laterality Date  . Cesarean section     History   Social History  . Marital Status: Married    Spouse Name: N/A    Number of Children: N/A  . Years of Education: N/A   Occupational History  . Not on file.   Social History Main Topics  . Smoking status: Never Smoker   . Smokeless tobacco: Not on file  . Alcohol Use: No  . Drug Use: No  . Sexually Active:    Other Topics Concern  . Not on file   Social History Narrative  . No narrative on file   No current facility-administered medications on file prior to encounter.   Current Outpatient Prescriptions on File Prior to Encounter  Medication Sig Dispense Refill  . ondansetron (ZOFRAN-ODT) 4 MG disintegrating tablet Take 1 tablet (4 mg total) by mouth every 8 (eight) hours as needed for nausea.  20 tablet  0   Allergies  Allergen Reactions  . Shellfish Allergy Hives  . Penicillins Rash    ROS: Pertinent items in HPI  OBJECTIVE Blood pressure 128/76,  pulse 104, temperature 98.3 F (36.8 C), temperature source Oral, resp. rate 16, height 5' 6.5" (1.689 m), weight 291 lb (131.997 kg), last menstrual period 09/08/2012. GENERAL: Obese female in no acute distress. Normal gait. HEENT: Normocephalic HEART: normal rate RESP: normal effort ABDOMEN: Soft, non-tender throughout. No bruising or skin disruption. Unable to get DT FHT EXTREMITIES: Nontender, no edema. Superficial scratch NEURO: Alert and oriented   MAU COURSE Acetaminophen 1000 mg given Bedside US: embryo with normal FHR noted, size appears less than dates   ASSESSMENT 1. Fall at home, initial encounter   2. Muscle strain   Viable early pregnancy s/p minor fall  Morbid obesity  PLAN Discharge home AVS on strain and advised ice to knees, heat/rest/acetaminophen to side   Medication List    TAKE these medications       ondansetron 4 MG disintegrating tablet  Commonly known as:  ZOFRAN-ODT  Take 1 tablet (4 mg total) by mouth every 8 (eight) hours as needed for nausea.     prenatal multivitamin Tabs  Take 1 tablet by mouth daily at 12 noon.       Follow-up Information  Schedule an appointment as soon as possible for a visit with [facility].   Contact information:   [address] [place] [address] [phone]       Danae Orleans, CNM 12/08/2012  5:59 PM

## 2012-12-08 NOTE — MAU Note (Signed)
Pt states was coming back from store, and tripped on carpeted steps. Landed on knees, and fell on her stomach (per son). Sides of abdomen are sore and knees are sore. Denies vaginal bleeding.

## 2012-12-10 ENCOUNTER — Inpatient Hospital Stay (HOSPITAL_COMMUNITY)
Admission: AD | Admit: 2012-12-10 | Discharge: 2012-12-11 | Disposition: A | Discharge status: Home / Self Care | Payer: 59 | Source: Ambulatory Visit | Attending: Obstetrics & Gynecology | Admitting: Obstetrics & Gynecology

## 2012-12-10 ENCOUNTER — Encounter (HOSPITAL_COMMUNITY): Payer: Self-pay | Admitting: *Deleted

## 2012-12-10 DIAGNOSIS — O99891 Other specified diseases and conditions complicating pregnancy: Secondary | ICD-10-CM | POA: Insufficient documentation

## 2012-12-10 DIAGNOSIS — R109 Unspecified abdominal pain: Secondary | ICD-10-CM

## 2012-12-10 DIAGNOSIS — S336XXA Sprain of sacroiliac joint, initial encounter: Secondary | ICD-10-CM | POA: Insufficient documentation

## 2012-12-10 DIAGNOSIS — M545 Low back pain, unspecified: Secondary | ICD-10-CM | POA: Insufficient documentation

## 2012-12-10 DIAGNOSIS — Y92009 Unspecified place in unspecified non-institutional (private) residence as the place of occurrence of the external cause: Secondary | ICD-10-CM | POA: Insufficient documentation

## 2012-12-10 DIAGNOSIS — T148XXA Other injury of unspecified body region, initial encounter: Secondary | ICD-10-CM

## 2012-12-10 DIAGNOSIS — R296 Repeated falls: Secondary | ICD-10-CM | POA: Insufficient documentation

## 2012-12-10 HISTORY — DX: Obesity, unspecified: E66.9

## 2012-12-10 LAB — URINALYSIS, ROUTINE W REFLEX MICROSCOPIC
Glucose, UA: NEGATIVE mg/dL
Leukocytes, UA: NEGATIVE
Protein, ur: NEGATIVE mg/dL
Specific Gravity, Urine: 1.025 (ref 1.005–1.030)
pH: 6.5 (ref 5.0–8.0)

## 2012-12-10 MED ORDER — ACETAMINOPHEN 500 MG PO TABS
1000.0000 mg | ORAL_TABLET | Freq: Once | ORAL | Status: AC
  Administered 2012-12-10: 1000 mg via ORAL
  Filled 2012-12-10: qty 2

## 2012-12-10 MED ORDER — CYCLOBENZAPRINE HCL 10 MG PO TABS
10.0000 mg | ORAL_TABLET | Freq: Once | ORAL | Status: AC
  Administered 2012-12-10: 10 mg via ORAL
  Filled 2012-12-10: qty 1

## 2012-12-10 NOTE — MAU Note (Signed)
Pt G2 P1 at 13.1wks, fell on Saturday, seen in MAU.  Today having right lower back pain.

## 2012-12-10 NOTE — MAU Provider Note (Signed)
History     CSN: [id number]  Arrival date and time: 12/10/12 2046   First Provider Initiated Contact with Patient 12/10/12 2305      Chief Complaint  Patient presents with  . Back Pain   HPI Ms. Brooke Chan is a 32 y.o. G2P1001 at [redacted]w[redacted]d who presents to MAU today with complaint of back pain. Patient fell on Saturday and had abdominal pains. These pains have resolved. Pain in lower back on right side only. Pain is rated at 7/10 today and has been worsening throughout the day. The pain is constant and worse with ambulation. Took tylenol 6 hours ago with little relief. No history of back problems. Denies vaginal bleeding, discharge, fever. Patient reports occasional nausea without vomiting.   OB History   Grav Para Term Preterm Abortions TAB SAB Ect Mult Living   2 1 1  0 0 0 0 0 0 1      Past Medical History  Diagnosis Date  . Obese     Past Surgical History  Procedure Laterality Date  . Cesarean section      History reviewed. No pertinent family history.  History  Substance Use Topics  . Smoking status: Never Smoker   . Smokeless tobacco: Not on file  . Alcohol Use: No    Allergies:  Allergies  Allergen Reactions  . Shellfish Allergy Hives  . Penicillins Rash    Prescriptions prior to admission  Medication Sig Dispense Refill  . ondansetron (ZOFRAN-ODT) 4 MG disintegrating tablet Take 1 tablet (4 mg total) by mouth every 8 (eight) hours as needed for nausea.  20 tablet  0  . Prenatal Vit-Fe Fumarate-FA (PRENATAL MULTIVITAMIN) TABS Take 1 tablet by mouth daily at 12 noon.        Review of Systems  Constitutional: Negative for fever, chills and malaise/fatigue.  Gastrointestinal: Positive for nausea. Negative for vomiting, abdominal pain, diarrhea and constipation.  Genitourinary:       Neg - vaginal bleeding, discharge  Musculoskeletal: Positive for back pain.   Physical Exam   Blood pressure 127/72, pulse 95, temperature 98.4 F (36.9 C), temperature  source Oral, resp. rate 18, height 5' 6.6" (1.692 m), weight 292 lb 3.2 oz (132.541 kg), last menstrual period 09/08/2012.  Physical Exam  Constitutional: She is oriented to person, place, and time. She appears well-developed and well-nourished. No distress.  HENT:  Head: Normocephalic and atraumatic.  Cardiovascular: Normal rate, regular rhythm and normal heart sounds.   Respiratory: Effort normal and breath sounds normal. No respiratory distress.  GI: Soft. She exhibits no distension and no mass. There is no tenderness. There is no rebound and no guarding.  Musculoskeletal:       Lumbar back: She exhibits tenderness and pain. She exhibits normal range of motion, no bony tenderness, no swelling, no edema, no deformity, no laceration and no spasm.       Back:  Neurological: She is alert and oriented to person, place, and time.  Skin: Skin is warm and dry. No erythema.  Psychiatric: She has a normal mood and affect.    MAU Course  Procedures None  MDM Flexeril and Tylenol given - patient reports improvement in symptoms after ~ 30 minutes  Assessment and Plan  A: Muscle strain of the lower back  P: Discharge home Rx for Flexeril given Encouraged Tylenol PRN for pain Patient advised to modify activity and use heat and ice for relief PRN Patient encouraged to call [facility] tomorrow to make an appointment  to establish prenatal care Patient may return to MAU as needed or if her condition were to change or worsen  Freddi Starr, PA-C  12/10/2012, 11:06 PM

## 2012-12-11 ENCOUNTER — Ambulatory Visit (INDEPENDENT_AMBULATORY_CARE_PROVIDER_SITE_OTHER): Payer: 59 | Admitting: Obstetrics

## 2012-12-11 ENCOUNTER — Ambulatory Visit (HOSPITAL_COMMUNITY)
Admission: RE | Admit: 2012-12-11 | Discharge: 2012-12-11 | Disposition: A | Discharge status: Home / Self Care | Payer: 59 | Source: Ambulatory Visit | Attending: Obstetrics | Admitting: Obstetrics

## 2012-12-11 ENCOUNTER — Encounter: Payer: Self-pay | Admitting: Obstetrics

## 2012-12-11 VITALS — BP 111/75 | HR 98 | Temp 98.7°F | Ht 66.5 in | Wt 292.2 lb

## 2012-12-11 DIAGNOSIS — Z3480 Encounter for supervision of other normal pregnancy, unspecified trimester: Secondary | ICD-10-CM

## 2012-12-11 DIAGNOSIS — R52 Pain, unspecified: Secondary | ICD-10-CM

## 2012-12-11 DIAGNOSIS — O34219 Maternal care for unspecified type scar from previous cesarean delivery: Secondary | ICD-10-CM | POA: Insufficient documentation

## 2012-12-11 DIAGNOSIS — E669 Obesity, unspecified: Secondary | ICD-10-CM | POA: Insufficient documentation

## 2012-12-11 DIAGNOSIS — Z3201 Encounter for pregnancy test, result positive: Secondary | ICD-10-CM

## 2012-12-11 MED ORDER — VITAFOL ULTRA 29-0.6-0.4-200 MG PO CAPS: 1.0000 | ORAL_CAPSULE | Freq: Every day | ORAL | Status: DC

## 2012-12-11 MED ORDER — CYCLOBENZAPRINE HCL 10 MG PO TABS: 10.0000 mg | ORAL_TABLET | Freq: Two times a day (BID) | ORAL | Status: DC | PRN

## 2012-12-11 NOTE — Progress Notes (Signed)
Subjective:    Brooke Chan is a 32 y.o. female who presents for evaluation of amenorrhea. Currently periods are occurring every 1 month. Bleeding is moderate. Periods were regular in the past occurring every 4 weeks. Patient has no relevant history of abnormal sexual development. Is there a chance of pregnancy? yes. HCG lab test done? Yes at [facility], positive. Factors that may be contributory to menstrual abnormalities include: Pregnancy. Pt states she fell on Saturday 12-08-2012. Pt states she went to the MAU and a bedside ultrasound was preformed which saw fetal heart tones. Note states baby looks to be smaller than dates. Pt states she is still having moderate back pain. Pt was given a prescription for Flexeril but is concerned about taking it.   Menstrual History: OB History   Grav Para Term Preterm Abortions TAB SAB Ect Mult Living   2 1 1  0 0 0 0 0 0 1      Menarche age: 88 Patient's last menstrual period was 09/08/2012.    The following portions of the patient's history were reviewed and updated as appropriate: allergies, current medications, past family history, past medical history, past social history, past surgical history and problem list.  Review of Systems Pertinent items are noted in HPI.    Objective:    BP 111/75  Pulse 98  Temp(Src) 98.7 F (37.1 C)  Ht 5' 6.5" (1.689 m)  Wt 292 lb 3.2 oz (132.541 kg)  BMI 46.46 kg/m2  LMP 09/08/2012 General:   alert and no distress  Skin:   normal  Lungs:   not examined  Heart:   not examined  Breasts:   not examined  Abdomen:  normal findings: soft, non-tender  Pelvis:  Exam deferred.   Lab Review Urine pregnancy test    Assessment:    The patient has amenorrhea.  Early pregnancy.   Plan:    All questions answered. Pelvic ultrasound. F/U in 4 weeks.

## 2012-12-17 ENCOUNTER — Encounter: Payer: Self-pay | Admitting: Obstetrics

## 2012-12-26 ENCOUNTER — Encounter: Payer: Self-pay | Admitting: *Deleted

## 2012-12-26 ENCOUNTER — Encounter: Payer: Self-pay | Admitting: Obstetrics

## 2012-12-26 ENCOUNTER — Ambulatory Visit (INDEPENDENT_AMBULATORY_CARE_PROVIDER_SITE_OTHER): Payer: 59 | Admitting: Obstetrics

## 2012-12-26 VITALS — BP 112/74 | Temp 98.6°F | Wt 289.0 lb

## 2012-12-26 DIAGNOSIS — N760 Acute vaginitis: Secondary | ICD-10-CM

## 2012-12-26 DIAGNOSIS — K219 Gastro-esophageal reflux disease without esophagitis: Secondary | ICD-10-CM

## 2012-12-26 DIAGNOSIS — Z3480 Encounter for supervision of other normal pregnancy, unspecified trimester: Secondary | ICD-10-CM

## 2012-12-26 DIAGNOSIS — Z3201 Encounter for pregnancy test, result positive: Secondary | ICD-10-CM

## 2012-12-26 LAB — POCT URINALYSIS DIPSTICK: Urobilinogen, UA: NEGATIVE

## 2012-12-26 MED ORDER — LORATADINE 10 MG PO TABS: 10.0000 mg | ORAL_TABLET | Freq: Every day | ORAL | Status: DC

## 2012-12-26 MED ORDER — OMEPRAZOLE 20 MG PO CPDR: 20.0000 mg | DELAYED_RELEASE_CAPSULE | Freq: Every day | ORAL | Status: DC

## 2012-12-26 MED ORDER — PRENATAL MULTIVITAMIN CH: 1.0000 | ORAL_TABLET | Freq: Every day | ORAL | Status: DC

## 2012-12-26 NOTE — Progress Notes (Signed)
Pulse-113  Subjective:    Brooke Chan is being seen today for her first obstetrical visit.  This is not a planned pregnancy. She is at [redacted]w[redacted]d gestation. Her obstetrical history is significant for obesity. Relationship with FOB: spouse, living together. Patient does intend to breast feed. Pregnancy history fully reviewed.  Menstrual History: OB History   Grav Para Term Preterm Abortions TAB SAB Ect Mult Living   2 1 1  0 0 0 0 0 0 1      Menarche age: 32  Patient's last menstrual period was 09/08/2012.    The following portions of the patient's history were reviewed and updated as appropriate: allergies, current medications, past family history, past medical history, past social history, past surgical history and problem list.  Review of Systems Pertinent items are noted in HPI.    Objective:    General appearance: alert and no distress Abdomen: normal findings: soft, non-tender Pelvic: cervix normal in appearance, external genitalia normal, no adnexal masses or tenderness, no cervical motion tenderness, uterus normal size, shape, and consistency and vagina normal without discharge    Assessment:    Pregnancy at [redacted]w[redacted]d weeks    Plan:    Initial labs drawn. Prenatal vitamins. Problem list reviewed and updated. AFP3 discussed: requested. Role of ultrasound in pregnancy discussed; fetal survey: requested. Amniocentesis discussed: not indicated. Follow up in 4 weeks. 50% of 20 min visit spent on counseling and coordination of care.

## 2012-12-26 NOTE — Addendum Note (Signed)
Addended by: Julaine Hua on: 12/26/2012 05:42 PM   Modules accepted: Orders

## 2012-12-27 ENCOUNTER — Encounter: Payer: 59 | Admitting: Obstetrics

## 2012-12-27 ENCOUNTER — Ambulatory Visit: Payer: 59 | Admitting: Obstetrics

## 2012-12-27 LAB — VITAMIN D 25 HYDROXY (VIT D DEFICIENCY, FRACTURES): Vit D, 25-Hydroxy: 32 ng/mL (ref 30–89)

## 2012-12-27 LAB — WET PREP BY MOLECULAR PROBE: Candida species: NEGATIVE

## 2012-12-27 LAB — OBSTETRIC PANEL
Eosinophils Relative: 4 % (ref 0–5)
HCT: 32.2 % — ABNORMAL LOW (ref 36.0–46.0)
Lymphocytes Relative: 26 % (ref 12–46)
Lymphs Abs: 2.2 10*3/uL (ref 0.7–4.0)
MCV: 74 fL — ABNORMAL LOW (ref 78.0–100.0)
Monocytes Absolute: 1 10*3/uL (ref 0.1–1.0)
Neutro Abs: 5.1 10*3/uL (ref 1.7–7.7)
Platelets: 357 10*3/uL (ref 150–400)
RBC: 4.35 MIL/uL (ref 3.87–5.11)
Rubella: 1.76 Index — ABNORMAL HIGH (ref <0.90)
WBC: 8.7 10*3/uL (ref 4.0–10.5)

## 2012-12-27 LAB — PAP IG W/ RFLX HPV ASCU

## 2012-12-27 LAB — GC/CHLAMYDIA PROBE AMP: CT Probe RNA: NEGATIVE

## 2012-12-27 LAB — HIV ANTIBODY (ROUTINE TESTING W REFLEX): HIV: NONREACTIVE

## 2012-12-28 LAB — URINE CULTURE
Colony Count: NO GROWTH
Organism ID, Bacteria: NO GROWTH

## 2012-12-28 LAB — HEMOGLOBINOPATHY EVALUATION: Hgb S Quant: 0 %

## 2013-01-01 ENCOUNTER — Telehealth: Payer: Self-pay | Admitting: *Deleted

## 2013-01-01 MED ORDER — METRONIDAZOLE 500 MG PO TABS: 500.0000 mg | ORAL_TABLET | Freq: Two times a day (BID) | ORAL | Status: AC

## 2013-01-01 NOTE — Telephone Encounter (Signed)
Call placed to patient at [phone], no answer. A voice message was left informing patient of Dr Clearance Coots review of results and rx to pharmacy. Message was left for patient to call back if any questions.

## 2013-01-02 ENCOUNTER — Encounter: Payer: Self-pay | Admitting: Obstetrics

## 2013-01-23 ENCOUNTER — Ambulatory Visit (INDEPENDENT_AMBULATORY_CARE_PROVIDER_SITE_OTHER): Payer: 59 | Admitting: Obstetrics

## 2013-01-23 ENCOUNTER — Encounter: Payer: Self-pay | Admitting: Obstetrics

## 2013-01-23 VITALS — BP 112/72 | Temp 97.6°F | Wt 294.0 lb

## 2013-01-23 DIAGNOSIS — Z369 Encounter for antenatal screening, unspecified: Secondary | ICD-10-CM

## 2013-01-23 DIAGNOSIS — Z3482 Encounter for supervision of other normal pregnancy, second trimester: Secondary | ICD-10-CM

## 2013-01-23 DIAGNOSIS — Z3480 Encounter for supervision of other normal pregnancy, unspecified trimester: Secondary | ICD-10-CM

## 2013-01-23 LAB — POCT URINALYSIS DIPSTICK
Blood, UA: NEGATIVE
Glucose, UA: NEGATIVE
Ketones, UA: NEGATIVE
Nitrite, UA: NEGATIVE
Spec Grav, UA: 1.02
pH, UA: 7

## 2013-01-23 NOTE — Progress Notes (Signed)
Pulse-108  Pt c/o vaginal itching x 1 week.

## 2013-01-24 ENCOUNTER — Encounter: Payer: 59 | Admitting: Obstetrics

## 2013-01-28 LAB — AFP, QUAD SCREEN
Age Alone: 1:492 {titer}
HCG, Total: 23462 m[IU]/mL
INH: 219.8 pg/mL
MoM for AFP: 1.1
MoM for hCG: 1.19
Open Spina bifida: NEGATIVE
Osb Risk: 1:19200 {titer}
Tri 18 Scr Risk Est: NEGATIVE
Trisomy 18 (Edward) Syndrome Interp.: 1:40300 {titer}

## 2013-02-04 ENCOUNTER — Ambulatory Visit (INDEPENDENT_AMBULATORY_CARE_PROVIDER_SITE_OTHER): Payer: 59 | Admitting: Obstetrics

## 2013-02-04 ENCOUNTER — Encounter: Payer: Self-pay | Admitting: Obstetrics

## 2013-02-04 VITALS — BP 114/75 | Temp 98.3°F | Wt 297.2 lb

## 2013-02-04 DIAGNOSIS — Z3480 Encounter for supervision of other normal pregnancy, unspecified trimester: Secondary | ICD-10-CM

## 2013-02-04 DIAGNOSIS — Z3482 Encounter for supervision of other normal pregnancy, second trimester: Secondary | ICD-10-CM

## 2013-02-04 LAB — POCT URINALYSIS DIPSTICK
Ketones, UA: NEGATIVE
Leukocytes, UA: NEGATIVE
Protein, UA: NEGATIVE
Spec Grav, UA: 1.015
pH, UA: 7

## 2013-02-04 NOTE — Progress Notes (Signed)
Pulse: 123

## 2013-02-14 ENCOUNTER — Encounter (HOSPITAL_COMMUNITY): Payer: Self-pay | Admitting: *Deleted

## 2013-02-14 ENCOUNTER — Inpatient Hospital Stay (HOSPITAL_COMMUNITY)
Admission: AD | Admit: 2013-02-14 | Discharge: 2013-02-14 | Disposition: A | Discharge status: Home / Self Care | Payer: 59 | Source: Ambulatory Visit | Attending: Obstetrics & Gynecology | Admitting: Obstetrics & Gynecology

## 2013-02-14 DIAGNOSIS — IMO0001 Reserved for inherently not codable concepts without codable children: Secondary | ICD-10-CM | POA: Insufficient documentation

## 2013-02-14 DIAGNOSIS — M7918 Myalgia, other site: Secondary | ICD-10-CM

## 2013-02-14 DIAGNOSIS — R109 Unspecified abdominal pain: Secondary | ICD-10-CM | POA: Insufficient documentation

## 2013-02-14 DIAGNOSIS — K219 Gastro-esophageal reflux disease without esophagitis: Secondary | ICD-10-CM

## 2013-02-14 DIAGNOSIS — O99891 Other specified diseases and conditions complicating pregnancy: Secondary | ICD-10-CM | POA: Insufficient documentation

## 2013-02-14 LAB — CBC
HCT: 30.8 % — ABNORMAL LOW (ref 36.0–46.0)
MCHC: 32.8 g/dL (ref 30.0–36.0)
MCV: 76.2 fL — ABNORMAL LOW (ref 78.0–100.0)
Platelets: 275 10*3/uL (ref 150–400)
RDW: 16 % — ABNORMAL HIGH (ref 11.5–15.5)

## 2013-02-14 LAB — COMPREHENSIVE METABOLIC PANEL
AST: 11 U/L (ref 0–37)
Albumin: 2.7 g/dL — ABNORMAL LOW (ref 3.5–5.2)
BUN: 6 mg/dL (ref 6–23)
Creatinine, Ser: 0.6 mg/dL (ref 0.50–1.10)
Total Protein: 6.6 g/dL (ref 6.0–8.3)

## 2013-02-14 MED ORDER — RANITIDINE HCL 150 MG PO TABS: 150.0000 mg | ORAL_TABLET | Freq: Two times a day (BID) | ORAL | Status: DC

## 2013-02-14 MED ORDER — GI COCKTAIL ~~LOC~~
30.0000 mL | Freq: Three times a day (TID) | ORAL | Status: DC | PRN
  Administered 2013-02-14: 30 mL via ORAL
  Filled 2013-02-14: qty 30

## 2013-02-14 NOTE — MAU Note (Signed)
Pt states upper abdominal pain/"pressure" and pain under left breast since last night. States feels like gas pain. Took gas pill but hasn't helped. Denies vaginal bleeding, vaginal discharge, or lower abdominal cramping.

## 2013-02-14 NOTE — MAU Provider Note (Signed)
Chief Complaint: Abdominal Pain  First Provider Initiated Contact with Patient 02/14/13 0739      SUBJECTIVE HPI: Brooke Chan is a 32 y.o. G2P1001 at [redacted]w[redacted]d by LMP who presents with pain under left breast since last night. Pain is constant, sharp, 4/10 on pain scale. No relief w/ Gas -X. Also reports bilat upper abd pressure yesterday. Resolved spontaneously.   Pt reports she tries to take her prescribed Prilosec daily but has missed some days recently.  She has also eaten more tomatoes in the last few days because a family member brought her some.    Past Medical History  Diagnosis Date  . Obese    OB History   Grav Para Term Preterm Abortions TAB SAB Ect Mult Living   2 1 1  0 0 0 0 0 0 1     # Outc Date GA Lbr Len/2nd Wgt Sex Del Anes PTL Lv   1 TRM 5/02    M LTCS Spinal  Yes   2 CUR              Past Surgical History  Procedure Laterality Date  . Cesarean section     History   Social History  . Marital Status: Married    Spouse Name: Prosperity Darrough     Number of Children: 1  . Years of Education: N/A   Occupational History  . DRIVER     [employer]    Social History Main Topics  . Smoking status: Never Smoker   . Smokeless tobacco: Never Used  . Alcohol Use: No  . Drug Use: No  . Sexually Active: Yes -- Female partner(s)   Other Topics Concern  . Not on file   Social History Narrative  . No narrative on file   No current facility-administered medications on file prior to encounter.   Current Outpatient Prescriptions on File Prior to Encounter  Medication Sig Dispense Refill  . omeprazole (PRILOSEC) 20 MG capsule Take 1 capsule (20 mg total) by mouth daily.  60 capsule  5  . Prenat-Fe Poly-Methfol-FA-DHA (VITAFOL ULTRA) 29-0.6-0.4-200 MG CAPS Take 1 capsule by mouth daily before breakfast.  90 capsule  3   Allergies  Allergen Reactions  . Shellfish Allergy Hives  . Penicillins Rash    ROS: Pos for pain under left breast, heartburn. Neg  constipation, fever, chills, recent trauma,   OBJECTIVE Blood pressure 128/81, pulse 111, temperature 97.8 F (36.6 C), temperature source Oral, resp. rate 18, height 5' 6.5" (1.689 m), weight 135.081 kg (297 lb 12.8 oz), last menstrual period 09/08/2012, SpO2 100.00%. GENERAL: Well-developed, well-nourished female in mild distress.  HEENT: Normocephalic HEART: normal rate, rhythm, heart sounds RESP: normal effort, lung sounds clear and equal bilaterally ABDOMEN: Soft, non-tender CHEST: Mild tenderness to palpation in chest wall under left breast EXTREMITIES: Nontender, no edema NEURO: Alert and oriented SPECULUM EXAM: Deferred FHR 164 by doppler.  LAB RESULTS Results for orders placed during the hospital encounter of 02/14/13 (from the past 24 hour(s))  CBC     Status: Abnormal   Collection Time    02/14/13  8:10 AM      Result Value Range   WBC 10.0  4.0 - 10.5 K/uL   RBC 4.04  3.87 - 5.11 MIL/uL   Hemoglobin 10.1 (*) 12.0 - 15.0 g/dL   HCT 16.1 (*) 09.6 - 04.5 %   MCV 76.2 (*) 78.0 - 100.0 fL   MCH 25.0 (*) 26.0 - 34.0 pg  MCHC 32.8  30.0 - 36.0 g/dL   RDW 04.5 (*) 40.9 - 81.1 %   Platelets 275  150 - 400 K/uL    MAU COURSE GI cocktail, CBC, CMET ordered. Dr. Clearance Coots agrees w/ POC.  Care of pt turned over to [facility] at [phone].  Dorathy Kinsman, CNM 02/14/2013  8:25 AM  GI Cocktail provided complete relief initially, but pt had some symptoms return when walking  A: 1. Acid reflux   2. Musculoskeletal pain     P: D/C home Switch medication to Zantac, plan to take BID but can take when symptomatic if missed Discussed acid reflux and dietary suggestions Rest, ice, heat, tylenol for musculoskeletal pain F/U with Dr Clearance Coots this week Return to MAU as needed  Sharen Counter Certified Nurse-Midwife

## 2013-02-20 ENCOUNTER — Encounter: Payer: Self-pay | Admitting: Obstetrics

## 2013-02-20 ENCOUNTER — Ambulatory Visit (INDEPENDENT_AMBULATORY_CARE_PROVIDER_SITE_OTHER): Payer: 59

## 2013-02-20 ENCOUNTER — Ambulatory Visit (INDEPENDENT_AMBULATORY_CARE_PROVIDER_SITE_OTHER): Payer: 59 | Admitting: Obstetrics

## 2013-02-20 VITALS — BP 123/64 | Temp 98.3°F | Wt 298.8 lb

## 2013-02-20 DIAGNOSIS — Z369 Encounter for antenatal screening, unspecified: Secondary | ICD-10-CM

## 2013-02-20 DIAGNOSIS — Z3482 Encounter for supervision of other normal pregnancy, second trimester: Secondary | ICD-10-CM

## 2013-02-20 DIAGNOSIS — Z3480 Encounter for supervision of other normal pregnancy, unspecified trimester: Secondary | ICD-10-CM

## 2013-02-20 DIAGNOSIS — K219 Gastro-esophageal reflux disease without esophagitis: Secondary | ICD-10-CM

## 2013-02-20 LAB — POCT URINALYSIS DIPSTICK
Ketones, UA: NEGATIVE
Leukocytes, UA: NEGATIVE
Spec Grav, UA: 1.015
pH, UA: 6

## 2013-02-20 MED ORDER — OMEPRAZOLE 20 MG PO CPDR: 20.0000 mg | DELAYED_RELEASE_CAPSULE | Freq: Two times a day (BID) | ORAL | Status: DC

## 2013-02-20 NOTE — Progress Notes (Signed)
Pulse-103 Acid reflux

## 2013-02-21 ENCOUNTER — Encounter: Payer: Self-pay | Admitting: Obstetrics

## 2013-02-21 LAB — US OB DETAIL + 14 WK

## 2013-03-06 ENCOUNTER — Ambulatory Visit (INDEPENDENT_AMBULATORY_CARE_PROVIDER_SITE_OTHER): Payer: 59 | Admitting: Obstetrics

## 2013-03-06 VITALS — BP 113/76 | Temp 98.6°F | Wt 300.0 lb

## 2013-03-06 DIAGNOSIS — Z3480 Encounter for supervision of other normal pregnancy, unspecified trimester: Secondary | ICD-10-CM

## 2013-03-06 DIAGNOSIS — R52 Pain, unspecified: Secondary | ICD-10-CM

## 2013-03-06 DIAGNOSIS — B373 Candidiasis of vulva and vagina: Secondary | ICD-10-CM

## 2013-03-06 DIAGNOSIS — Z3482 Encounter for supervision of other normal pregnancy, second trimester: Secondary | ICD-10-CM

## 2013-03-06 LAB — POCT URINALYSIS DIPSTICK
Spec Grav, UA: 1.02
Urobilinogen, UA: NEGATIVE
pH, UA: 7

## 2013-03-06 MED ORDER — TRAMADOL HCL 50 MG PO TABS: 50.0000 mg | ORAL_TABLET | Freq: Four times a day (QID) | ORAL | Status: DC | PRN

## 2013-03-06 MED ORDER — FLUCONAZOLE 150 MG PO TABS: 150.0000 mg | ORAL_TABLET | Freq: Once | ORAL | Status: DC

## 2013-03-06 NOTE — Progress Notes (Signed)
P 111 Patient c/o increased pelvic pressure.

## 2013-03-07 ENCOUNTER — Encounter: Payer: Self-pay | Admitting: Obstetrics

## 2013-03-07 DIAGNOSIS — R52 Pain, unspecified: Secondary | ICD-10-CM | POA: Insufficient documentation

## 2013-03-07 DIAGNOSIS — B373 Candidiasis of vulva and vagina: Secondary | ICD-10-CM | POA: Insufficient documentation

## 2013-03-20 ENCOUNTER — Ambulatory Visit (INDEPENDENT_AMBULATORY_CARE_PROVIDER_SITE_OTHER): Payer: 59 | Admitting: Obstetrics

## 2013-03-20 ENCOUNTER — Encounter: Payer: Self-pay | Admitting: Obstetrics

## 2013-03-20 VITALS — BP 124/68 | Temp 98.3°F | Wt 299.0 lb

## 2013-03-20 DIAGNOSIS — Z3482 Encounter for supervision of other normal pregnancy, second trimester: Secondary | ICD-10-CM

## 2013-03-20 DIAGNOSIS — K219 Gastro-esophageal reflux disease without esophagitis: Secondary | ICD-10-CM

## 2013-03-20 DIAGNOSIS — A499 Bacterial infection, unspecified: Secondary | ICD-10-CM

## 2013-03-20 DIAGNOSIS — N760 Acute vaginitis: Secondary | ICD-10-CM

## 2013-03-20 DIAGNOSIS — Z23 Encounter for immunization: Secondary | ICD-10-CM

## 2013-03-20 DIAGNOSIS — Z3480 Encounter for supervision of other normal pregnancy, unspecified trimester: Secondary | ICD-10-CM

## 2013-03-20 DIAGNOSIS — B9689 Other specified bacterial agents as the cause of diseases classified elsewhere: Secondary | ICD-10-CM

## 2013-03-20 LAB — POCT URINALYSIS DIPSTICK
Bilirubin, UA: NEGATIVE
Blood, UA: NEGATIVE
Ketones, UA: 2
pH, UA: 7

## 2013-03-20 MED ORDER — METRONIDAZOLE 500 MG PO TABS: 500.0000 mg | ORAL_TABLET | Freq: Two times a day (BID) | ORAL | Status: DC

## 2013-03-20 MED ORDER — INFLUENZA VAC SPLIT QUAD 0.5 ML IM SUSP: 0.5000 mL | Freq: Once | INTRAMUSCULAR | Status: DC

## 2013-03-20 MED ORDER — OMEPRAZOLE 20 MG PO CPDR: 20.0000 mg | DELAYED_RELEASE_CAPSULE | Freq: Every day | ORAL | Status: DC

## 2013-03-20 NOTE — Progress Notes (Signed)
P 103 Patient thinks she has BV request to be checked

## 2013-03-20 NOTE — Addendum Note (Signed)
Addended by: George Hugh on: 03/20/2013 07:16 PM   Modules accepted: Orders

## 2013-03-21 ENCOUNTER — Other Ambulatory Visit (INDEPENDENT_AMBULATORY_CARE_PROVIDER_SITE_OTHER): Payer: 59 | Admitting: *Deleted

## 2013-03-21 ENCOUNTER — Other Ambulatory Visit: Payer: Self-pay | Admitting: *Deleted

## 2013-03-21 DIAGNOSIS — Z23 Encounter for immunization: Secondary | ICD-10-CM

## 2013-03-21 LAB — CBC
HCT: 30.9 % — ABNORMAL LOW (ref 36.0–46.0)
Hemoglobin: 9.9 g/dL — ABNORMAL LOW (ref 12.0–15.0)
RBC: 4.02 MIL/uL (ref 3.87–5.11)
WBC: 9.3 10*3/uL (ref 4.0–10.5)

## 2013-03-21 LAB — GLUCOSE TOLERANCE, 2 HOURS W/ 1HR: Glucose, 2 hour: 96 mg/dL (ref 70–139)

## 2013-03-21 LAB — RPR

## 2013-03-21 MED ORDER — LIDOCAINE-HYDROCORTISONE ACE 3-1 % RE KIT: 1.0000 "application " | PACK | Freq: Two times a day (BID) | RECTAL | Status: DC

## 2013-03-22 ENCOUNTER — Other Ambulatory Visit: Payer: Self-pay | Admitting: *Deleted

## 2013-03-26 ENCOUNTER — Encounter: Payer: Self-pay | Admitting: Obstetrics

## 2013-03-26 ENCOUNTER — Encounter: Payer: Self-pay | Admitting: Obstetrics & Gynecology

## 2013-04-03 ENCOUNTER — Encounter: Payer: Self-pay | Admitting: Obstetrics

## 2013-04-03 ENCOUNTER — Ambulatory Visit (INDEPENDENT_AMBULATORY_CARE_PROVIDER_SITE_OTHER): Payer: 59 | Admitting: Obstetrics

## 2013-04-03 VITALS — BP 128/74 | Temp 97.2°F | Wt 301.0 lb

## 2013-04-03 DIAGNOSIS — Z3482 Encounter for supervision of other normal pregnancy, second trimester: Secondary | ICD-10-CM

## 2013-04-03 DIAGNOSIS — Z3480 Encounter for supervision of other normal pregnancy, unspecified trimester: Secondary | ICD-10-CM

## 2013-04-03 LAB — POCT URINALYSIS DIPSTICK
Bilirubin, UA: NEGATIVE
Blood, UA: NEGATIVE
Glucose, UA: NEGATIVE
Ketones, UA: NEGATIVE
Nitrite, UA: NEGATIVE
Spec Grav, UA: 1.015
Urobilinogen, UA: NEGATIVE
pH, UA: 7

## 2013-04-03 NOTE — Progress Notes (Signed)
P=106 

## 2013-04-04 ENCOUNTER — Other Ambulatory Visit: Payer: Self-pay | Admitting: Obstetrics

## 2013-04-17 ENCOUNTER — Other Ambulatory Visit: Payer: Self-pay | Admitting: Obstetrics

## 2013-04-17 ENCOUNTER — Ambulatory Visit (INDEPENDENT_AMBULATORY_CARE_PROVIDER_SITE_OTHER): Payer: 59 | Admitting: Obstetrics

## 2013-04-17 ENCOUNTER — Encounter: Payer: Self-pay | Admitting: Obstetrics

## 2013-04-17 VITALS — BP 104/70 | Temp 98.7°F | Wt 300.0 lb

## 2013-04-17 DIAGNOSIS — O3663X1 Maternal care for excessive fetal growth, third trimester, fetus 1: Secondary | ICD-10-CM

## 2013-04-17 DIAGNOSIS — Z3483 Encounter for supervision of other normal pregnancy, third trimester: Secondary | ICD-10-CM

## 2013-04-17 DIAGNOSIS — Z3482 Encounter for supervision of other normal pregnancy, second trimester: Secondary | ICD-10-CM

## 2013-04-17 DIAGNOSIS — Z3480 Encounter for supervision of other normal pregnancy, unspecified trimester: Secondary | ICD-10-CM

## 2013-04-17 LAB — POCT URINALYSIS DIPSTICK
Blood, UA: NEGATIVE
Glucose, UA: NEGATIVE
Nitrite, UA: NEGATIVE
Urobilinogen, UA: NEGATIVE

## 2013-04-17 NOTE — Progress Notes (Signed)
Pulse-105 

## 2013-04-25 ENCOUNTER — Encounter (HOSPITAL_COMMUNITY): Payer: Self-pay | Admitting: *Deleted

## 2013-04-25 ENCOUNTER — Encounter: Payer: Self-pay | Admitting: Obstetrics

## 2013-04-25 ENCOUNTER — Encounter: Payer: Self-pay | Admitting: *Deleted

## 2013-04-25 ENCOUNTER — Ambulatory Visit (INDEPENDENT_AMBULATORY_CARE_PROVIDER_SITE_OTHER): Payer: 59 | Admitting: Obstetrics

## 2013-04-25 ENCOUNTER — Inpatient Hospital Stay (HOSPITAL_COMMUNITY)
Admission: AD | Admit: 2013-04-25 | Discharge: 2013-04-25 | Disposition: A | Discharge status: Home / Self Care | Payer: 59 | Source: Ambulatory Visit | Attending: Obstetrics | Admitting: Obstetrics

## 2013-04-25 ENCOUNTER — Inpatient Hospital Stay (HOSPITAL_COMMUNITY): Payer: 59

## 2013-04-25 VITALS — BP 120/80 | Temp 97.7°F | Wt 298.0 lb

## 2013-04-25 DIAGNOSIS — O99891 Other specified diseases and conditions complicating pregnancy: Secondary | ICD-10-CM | POA: Insufficient documentation

## 2013-04-25 DIAGNOSIS — Z3480 Encounter for supervision of other normal pregnancy, unspecified trimester: Secondary | ICD-10-CM

## 2013-04-25 DIAGNOSIS — Z3483 Encounter for supervision of other normal pregnancy, third trimester: Secondary | ICD-10-CM

## 2013-04-25 DIAGNOSIS — J029 Acute pharyngitis, unspecified: Secondary | ICD-10-CM

## 2013-04-25 DIAGNOSIS — R059 Cough, unspecified: Secondary | ICD-10-CM | POA: Insufficient documentation

## 2013-04-25 DIAGNOSIS — J329 Chronic sinusitis, unspecified: Secondary | ICD-10-CM | POA: Insufficient documentation

## 2013-04-25 DIAGNOSIS — J069 Acute upper respiratory infection, unspecified: Secondary | ICD-10-CM

## 2013-04-25 DIAGNOSIS — B373 Candidiasis of vulva and vagina: Secondary | ICD-10-CM

## 2013-04-25 DIAGNOSIS — J302 Other seasonal allergic rhinitis: Secondary | ICD-10-CM

## 2013-04-25 DIAGNOSIS — J309 Allergic rhinitis, unspecified: Secondary | ICD-10-CM

## 2013-04-25 DIAGNOSIS — R05 Cough: Secondary | ICD-10-CM | POA: Insufficient documentation

## 2013-04-25 HISTORY — DX: Headache: R51

## 2013-04-25 LAB — CBC
MCH: 24.6 pg — ABNORMAL LOW (ref 26.0–34.0)
MCHC: 32.1 g/dL (ref 30.0–36.0)
Platelets: 280 10*3/uL (ref 150–400)
RBC: 3.94 MIL/uL (ref 3.87–5.11)

## 2013-04-25 MED ORDER — DM-GUAIFENESIN ER 30-600 MG PO TB12
1.0000 | ORAL_TABLET | Freq: Two times a day (BID) | ORAL | Status: DC
Start: 2013-04-25 — End: 2013-04-25

## 2013-04-25 MED ORDER — AZITHROMYCIN 250 MG PO TABS
ORAL_TABLET | ORAL | Status: DC
Start: 2013-04-25 — End: 2013-04-25

## 2013-04-25 MED ORDER — GUAIFENESIN-DM 100-10 MG/5ML PO SYRP
5.0000 mL | ORAL_SOLUTION | ORAL | Status: DC | PRN
  Administered 2013-04-25: 5 mL via ORAL
  Filled 2013-04-25: qty 5

## 2013-04-25 MED ORDER — LORATADINE 10 MG PO TABS: 10.0000 mg | ORAL_TABLET | Freq: Every day | ORAL | Status: DC

## 2013-04-25 MED ORDER — FLUCONAZOLE 150 MG PO TABS: ORAL_TABLET | ORAL | Status: DC

## 2013-04-25 NOTE — MAU Provider Note (Signed)
History     CSN: [id number]  Arrival date and time: 04/25/13 1633   First Provider Initiated Contact with Patient 04/25/13 1819      Chief Complaint  Patient presents with  . Cough  . Epistaxis   HPI Pt is [redacted]w[redacted]d pregnant c/o of SOB and cough/sinus infection.  Pt has had several episodes of nose bleeds.  Pt saw Dr. Clearance Coots in the office today and was given antibiotics, which she has not started.  Pt says this afternoon she says she had some blood clots that she coughed up, grape size.  Pt denies pain with breathing, wheezing or mucous. Pt denies vaginal discharge or bleeding. Pt has not had fever or chills. RN nurse note: Patient states she was seen in the office this am and was given a Rx for an antibiotic for a sinus infection(has not started taking). States she has had a cough. After taking a nap she woke up with a bloody nose and started coughing and coughed up a blood clot. States she has coughed up several clots since that time. Denies bleeding or leaking. Reports good fetal movement. Has a little abdominal pressure   Past Medical History  Diagnosis Date  . Obese   . [id number](478.2)     Past Surgical History  Procedure Laterality Date  . Cesarean section      Family History  Problem Relation Age of Onset  . Colitis Mother   . Seizures Mother   . Congestive Heart Failure Mother   . Hyperlipidemia Mother   . Breast cancer Maternal Aunt   . Heart disease Maternal Grandfather   . Asthma Maternal Grandmother   . Heart attack Maternal Grandmother   . Cancer    . Kidney disease    . Brain cancer    . Prostate cancer    . Hypertension Father     History  Substance Use Topics  . Smoking status: Never Smoker   . Smokeless tobacco: Never Used  . Alcohol Use: No    Allergies:  Allergies  Allergen Reactions  . Shellfish Allergy Hives  . Penicillins Rash    Prescriptions prior to admission  Medication Sig Dispense Refill  . lidocaine-hydrocortisone (ANAMANTLE HC  FORTE) 3-1 % KIT Place 1 application rectally 2 (two) times daily.  1 each  1  . omeprazole (PRILOSEC) 20 MG capsule Take 1 capsule (20 mg total) by mouth daily.  60 capsule  5  . Prenat-Fe Poly-Methfol-FA-DHA (VITAFOL ULTRA) 29-0.6-0.4-200 MG CAPS Take 1 capsule by mouth daily before breakfast.  90 capsule  3  . [DISCONTINUED] azithromycin (ZITHROMAX) 250 MG tablet Take 2 tablets po initially, then take 1 po daily.  6 tablet  2  . [DISCONTINUED] Ca Carbonate-Mag Hydroxide (ROLAIDS PO) Take by mouth as needed.      . [DISCONTINUED] fluconazole (DIFLUCAN) 150 MG tablet TAKE ONE TABLET BY MOUTH ONCE  1 tablet  2  . [DISCONTINUED] influenza vac split quadrivalent PF (FLUARIX QUADRIVALENT) 0.5 ML injection Inject 0.5 mLs into the muscle once.  0.5 mL  0  . [DISCONTINUED] loratadine (CLARITIN) 10 MG tablet Take 1 tablet (10 mg total) by mouth daily.  30 tablet  11  . [DISCONTINUED] metroNIDAZOLE (FLAGYL) 500 MG tablet Take 1 tablet (500 mg total) by mouth 2 (two) times daily.  14 tablet  2  . [DISCONTINUED] omeprazole (PRILOSEC) 20 MG capsule Take 1 capsule (20 mg total) by mouth every 12 (twelve) hours.  60 capsule  5  . [DISCONTINUED] ranitidine (  ZANTAC) 150 MG tablet Take 1 tablet (150 mg total) by mouth 2 (two) times daily.  60 tablet  3  . [DISCONTINUED] traMADol (ULTRAM) 50 MG tablet Take 1 tablet (50 mg total) by mouth every 6 (six) hours as needed for pain.  40 tablet  2    Review of Systems  Constitutional: Negative for fever and chills.  HENT: Positive for nosebleeds and congestion.   Respiratory: Positive for cough, hemoptysis, sputum production and shortness of breath.   Cardiovascular: Negative for chest pain.  Gastrointestinal: Negative for nausea, vomiting and abdominal pain.  Musculoskeletal: Positive for back pain.   Physical Exam   Blood pressure 106/62, pulse 105, temperature 98.2 F (36.8 C), temperature source Oral, resp. rate 20, height 5' 5.5" (1.664 m), weight 135.898 kg  (299 lb 9.6 oz), last menstrual period 09/08/2012, SpO2 100.00%.  Physical Exam  Nursing note and vitals reviewed. Constitutional: She is oriented to person, place, and time. She appears well-developed and well-nourished. No distress.  HENT:  Head: Normocephalic.  Eyes: Pupils are equal, round, and reactive to light.  Neck: Normal range of motion.  Cardiovascular: Normal rate and regular rhythm.   Respiratory: Breath sounds normal. No respiratory distress. She has no wheezes. She has no rales. She exhibits no tenderness.  GI: Soft.  Musculoskeletal: Normal range of motion.  Neurological: She is alert and oriented to person, place, and time.  Skin: Skin is warm and dry.  Psychiatric: She has a normal mood and affect.   Dg Chest 2 View  04/25/2013   CLINICAL DATA:  Twenty weeks pregnant. Cough.  EXAM: CHEST  2 VIEW  COMPARISON:  09/03/2006  FINDINGS: The heart size and mediastinal contours are within normal limits. Both lungs are clear. The visualized skeletal structures are unremarkable.  IMPRESSION: No active cardiopulmonary disease.   Electronically Signed   By: Charlett Nose M.D.   On: 04/25/2013 21:08    MAU Course  Procedures guafenisin given Sputum culture ordered Dr. Gaynell Face called- Chest X-Ray ordered Care turned over to Jannifer Rodney, NP  Note to be out of work till Monday  Assessment and Plan    LINEBERRY,SUSAN 04/25/2013, 6:32 PM

## 2013-04-25 NOTE — Progress Notes (Signed)
Pt states she is having some pain that is on her left side. Pt states she is having some lower back pain.

## 2013-04-25 NOTE — MAU Note (Signed)
Patient states she was seen in the office this am and was given a Rx for an antibiotic for a sinus infection(has not started taking). States she has had a cough. After taking a nap she woke up with a bloody nose and started coughing and coughed up a blood clot. States she has coughed up several clots since that time. Denies bleeding or leaking. Reports good fetal movement. Has a little abdominal pressure.

## 2013-04-28 LAB — CULTURE, RESPIRATORY W GRAM STAIN: Special Requests: NORMAL

## 2013-04-29 ENCOUNTER — Encounter: Payer: 59 | Admitting: Obstetrics

## 2013-05-02 ENCOUNTER — Encounter: Payer: Self-pay | Admitting: Obstetrics

## 2013-05-09 ENCOUNTER — Ambulatory Visit (INDEPENDENT_AMBULATORY_CARE_PROVIDER_SITE_OTHER): Payer: 59 | Admitting: Obstetrics

## 2013-05-09 ENCOUNTER — Other Ambulatory Visit: Payer: Self-pay | Admitting: Obstetrics

## 2013-05-09 ENCOUNTER — Ambulatory Visit (HOSPITAL_COMMUNITY): Admission: RE | Admit: 2013-05-09 | Payer: 59 | Source: Ambulatory Visit

## 2013-05-09 ENCOUNTER — Ambulatory Visit (HOSPITAL_COMMUNITY)
Admission: RE | Admit: 2013-05-09 | Discharge: 2013-05-09 | Disposition: A | Discharge status: Home / Self Care | Payer: 59 | Source: Ambulatory Visit | Attending: Obstetrics | Admitting: Obstetrics

## 2013-05-09 ENCOUNTER — Encounter: Payer: Self-pay | Admitting: Obstetrics

## 2013-05-09 VITALS — BP 103/72 | Temp 98.3°F | Wt 298.4 lb

## 2013-05-09 DIAGNOSIS — O3663X1 Maternal care for excessive fetal growth, third trimester, fetus 1: Secondary | ICD-10-CM

## 2013-05-09 DIAGNOSIS — O3660X0 Maternal care for excessive fetal growth, unspecified trimester, not applicable or unspecified: Secondary | ICD-10-CM | POA: Insufficient documentation

## 2013-05-09 DIAGNOSIS — E669 Obesity, unspecified: Secondary | ICD-10-CM | POA: Insufficient documentation

## 2013-05-09 DIAGNOSIS — Z3480 Encounter for supervision of other normal pregnancy, unspecified trimester: Secondary | ICD-10-CM

## 2013-05-09 DIAGNOSIS — Z3483 Encounter for supervision of other normal pregnancy, third trimester: Secondary | ICD-10-CM

## 2013-05-09 LAB — POCT URINALYSIS DIPSTICK
Bilirubin, UA: NEGATIVE
Blood, UA: NEGATIVE
Glucose, UA: NEGATIVE
Ketones, UA: NEGATIVE
Spec Grav, UA: 1.015

## 2013-05-09 NOTE — Progress Notes (Signed)
Pulse:109 

## 2013-05-17 ENCOUNTER — Encounter: Payer: Self-pay | Admitting: Obstetrics

## 2013-05-19 ENCOUNTER — Inpatient Hospital Stay (HOSPITAL_COMMUNITY)
Admission: AD | Admit: 2013-05-19 | Discharge: 2013-05-19 | Disposition: A | Discharge status: Home / Self Care | Payer: 59 | Source: Ambulatory Visit | Attending: Obstetrics | Admitting: Obstetrics

## 2013-05-19 ENCOUNTER — Encounter (HOSPITAL_COMMUNITY): Payer: Self-pay | Admitting: *Deleted

## 2013-05-19 DIAGNOSIS — R42 Dizziness and giddiness: Secondary | ICD-10-CM | POA: Insufficient documentation

## 2013-05-19 DIAGNOSIS — R55 Syncope and collapse: Secondary | ICD-10-CM

## 2013-05-19 DIAGNOSIS — O99019 Anemia complicating pregnancy, unspecified trimester: Secondary | ICD-10-CM | POA: Insufficient documentation

## 2013-05-19 DIAGNOSIS — D509 Iron deficiency anemia, unspecified: Secondary | ICD-10-CM

## 2013-05-19 DIAGNOSIS — D649 Anemia, unspecified: Secondary | ICD-10-CM | POA: Insufficient documentation

## 2013-05-19 DIAGNOSIS — O99891 Other specified diseases and conditions complicating pregnancy: Secondary | ICD-10-CM

## 2013-05-19 LAB — CBC
HCT: 28.9 % — ABNORMAL LOW (ref 36.0–46.0)
MCV: 77.3 fL — ABNORMAL LOW (ref 78.0–100.0)
Platelets: 287 10*3/uL (ref 150–400)
RBC: 3.74 MIL/uL — ABNORMAL LOW (ref 3.87–5.11)
RDW: 16 % — ABNORMAL HIGH (ref 11.5–15.5)
WBC: 10.7 10*3/uL — ABNORMAL HIGH (ref 4.0–10.5)

## 2013-05-19 LAB — URINALYSIS, ROUTINE W REFLEX MICROSCOPIC
Glucose, UA: NEGATIVE mg/dL
Hgb urine dipstick: NEGATIVE
Specific Gravity, Urine: 1.02 (ref 1.005–1.030)
pH: 6.5 (ref 5.0–8.0)

## 2013-05-19 LAB — BASIC METABOLIC PANEL
BUN: 7 mg/dL (ref 6–23)
CO2: 20 mEq/L (ref 19–32)
Chloride: 103 mEq/L (ref 96–112)
Creatinine, Ser: 0.62 mg/dL (ref 0.50–1.10)
GFR calc Af Amer: >90 mL/min (ref >90)
Glucose, Bld: 98 mg/dL (ref 70–99)
Potassium: 3.6 mEq/L (ref 3.5–5.1)

## 2013-05-19 MED ORDER — INTEGRA F 125-1 MG PO CAPS: 1.0000 | ORAL_CAPSULE | Freq: Every day | ORAL | Status: DC

## 2013-05-19 MED ORDER — LACTATED RINGERS IV BOLUS (SEPSIS): 1000.0000 mL | Freq: Once | INTRAVENOUS | Status: DC

## 2013-05-19 NOTE — MAU Note (Signed)
Pt states began feeling dizzy around 1100 today. Is sitting down when feeling dizzy, like room is spinning. Had h/a earlier today. Has also been having back pain on right side. Denies uti s/s. Was told at beginning of pregnancy that baby was resting on nerve. Denies bleeding or vaginal discharge. No vomiting today, however has been nauseated.

## 2013-05-19 NOTE — MAU Provider Note (Signed)
Chief Complaint:  No chief complaint on file.   First Provider Initiated Contact with Patient 05/19/13 1821      HPI: Brooke Chan is a 32 y.o. G2P1001 at [redacted]w[redacted]d who presents to maternity admissions reporting dizzy and this intermittently over the last few weeks. Earlier today had a more severe episode where she felt like the room is spinning and she might faint. She was sitting at the time. She has known anemia and ice pica. Feels like she eats all the time and is not skipping meals. Drinking plenty of fluids. Denies contractions, leakage of fluid or vaginal bleeding. Good fetal movement.   Pregnancy Course: obesity; sciatica; normal glucose testing  Past Medical History: Past Medical History  Diagnosis Date  . Obese   . [id number](478.2)     Past obstetric history: OB History  Gravida Para Term Preterm AB SAB TAB Ectopic Multiple Living  2 1 1  0 0 0 0 0 0 1    # Outcome Date GA Lbr Len/2nd Weight Sex Delivery Anes PTL Lv  2 CUR           1 TRM 11/22/00    M LTCS Spinal  Y      Past Surgical History: Past Surgical History  Procedure Laterality Date  . Cesarean section       Family History: Family History  Problem Relation Age of Onset  . Colitis Mother   . Seizures Mother   . Congestive Heart Failure Mother   . Hyperlipidemia Mother   . Breast cancer Maternal Aunt   . Heart disease Maternal Grandfather   . Asthma Maternal Grandmother   . Heart attack Maternal Grandmother   . Cancer    . Kidney disease    . Brain cancer    . Prostate cancer    . Hypertension Father     Social History: History  Substance Use Topics  . Smoking status: Never Smoker   . Smokeless tobacco: Never Used  . Alcohol Use: No    Allergies:  Allergies  Allergen Reactions  . Shellfish Allergy Hives  . Penicillins Rash    Meds:  Prescriptions prior to admission  Medication Sig Dispense Refill  . lidocaine-hydrocortisone (ANAMANTLE) 3-1 % KIT Place 1 application rectally 2 (two)  times daily.      Marland Kitchen omeprazole (PRILOSEC) 20 MG capsule Take 1 capsule (20 mg total) by mouth daily.  60 capsule  5  . Prenat-Fe Poly-Methfol-FA-DHA (VITAFOL ULTRA) 29-0.6-0.4-200 MG CAPS Take 1 capsule by mouth daily before breakfast.  90 capsule  3    ROS: Pertinent findings in history of present illness.  Physical Exam  Blood pressure 118/75, pulse 115, temperature 98.7 F (37.1 C), temperature source Oral, resp. rate 18, height 5' 5.5" (1.664 m), weight 300 lb 2 oz (136.136 kg), last menstrual period 09/08/2012. Filed Vitals:   05/19/13 1737 05/19/13 1907 05/19/13 1908 05/19/13 1910  BP: 118/75 114/66 116/55 115/58  Pulse: 115 110 104 130  Temp: 98.7 F (37.1 C)     TempSrc: Oral     Resp: 18     Height: 5' 5.5" (1.664 m)     Weight: 300 lb 2 oz (136.136 kg)      GENERAL: Obese female in no acute distress.  HEENT: normocephalic HEART: normal rate RESP: normal effort ABDOMEN: Soft, non-tender, gravid appropriate for gestational age EXTREMITIES: Nontender, no edema NEURO: alert and oriented  FHT:  Baseline 150 , moderate variability, accelerations present, no decelerations Contractions: none  Labs: Results for orders placed during the hospital encounter of 05/19/13 (from the past 24 hour(s))  URINALYSIS, ROUTINE W REFLEX MICROSCOPIC     Status: None   Collection Time    05/19/13  5:41 PM      Result Value Range   Color, Urine YELLOW  YELLOW   APPearance CLEAR  CLEAR   Specific Gravity, Urine 1.020  1.005 - 1.030   pH 6.5  5.0 - 8.0   Glucose, UA NEGATIVE  NEGATIVE mg/dL   Hgb urine dipstick NEGATIVE  NEGATIVE   Bilirubin Urine NEGATIVE  NEGATIVE   Ketones, ur NEGATIVE  NEGATIVE mg/dL   Protein, ur NEGATIVE  NEGATIVE mg/dL   Urobilinogen, UA 0.2  0.0 - 1.0 mg/dL   Nitrite NEGATIVE  NEGATIVE   Leukocytes, UA NEGATIVE  NEGATIVE  CBC     Status: Abnormal   Collection Time    05/19/13  7:00 PM      Result Value Range   WBC 10.7 (*) 4.0 - 10.5 K/uL   RBC 3.74 (*)  3.87 - 5.11 MIL/uL   Hemoglobin 9.3 (*) 12.0 - 15.0 g/dL   HCT 01.0 (*) 27.2 - 53.6 %   MCV 77.3 (*) 78.0 - 100.0 fL   MCH 24.9 (*) 26.0 - 34.0 pg   MCHC 32.2  30.0 - 36.0 g/dL   RDW 64.4 (*) 03.4 - 74.2 %   Platelets 287  150 - 400 K/uL  BASIC METABOLIC PANEL     Status: Abnormal   Collection Time    05/19/13  7:00 PM      Result Value Range   Sodium 133 (*) 135 - 145 mEq/L   Potassium 3.6  3.5 - 5.1 mEq/L   Chloride 103  96 - 112 mEq/L   CO2 20  19 - 32 mEq/L   Glucose, Bld 98  70 - 99 mg/dL   BUN 7  6 - 23 mg/dL   Creatinine, Ser 5.95  0.50 - 1.10 mg/dL   Calcium 8.6  8.4 - 63.8 mg/dL   GFR calc non Af Amer >90  >90 mL/min   GFR calc Af Amer >90  >90 mL/min   MAU Course Significant tachycardia on standing and mildly dehydrated still after po fluids> LR 1000 bolus Difficulty starting IV> will discharge after infused. Eating Assessment: 1. Near syncope   2. Anemia, iron deficiency   G2P1001 at [redacted]w[redacted]d  Plan: Discharge home See AVS for patient education     Medication List         lidocaine-hydrocortisone 3-1 % Kit  Commonly known as:  ANAMANTLE  Place 1 application rectally 2 (two) times daily.     omeprazole 20 MG capsule  Commonly known as:  PRILOSEC  Take 1 capsule (20 mg total) by mouth daily.     VITAFOL ULTRA 29-0.6-0.4-200 MG Caps  Take 1 capsule by mouth daily before breakfast.       Follow-up Information   Follow up with HARPER,CHARLES A, MD. (keep scheduled appointment)    Specialty:  Obstetrics and Gynecology   Contact information:   [address] [place] [address] [phone]       Danae Orleans, CNM 05/19/2013 6:23 PM  Care assumed by Sid Falcon CNM at 2040  2140 Notified by nurse that unable to get IV start x 5.  Pt reassessed tolerating PO without difficulty.  Urine results normal.  CVS - RRR, without murmur, gallops, and rubs.  Lungs CTA bilat.  Results for orders placed during the hospital encounter of  05/19/13 (from the past 24 hour(s))  URINALYSIS, ROUTINE W REFLEX MICROSCOPIC     Status: None   Collection Time    05/19/13  5:41 PM      Result Value Range   Color, Urine YELLOW  YELLOW   APPearance CLEAR  CLEAR   Specific Gravity, Urine 1.020  1.005 - 1.030   pH 6.5  5.0 - 8.0   Glucose, UA NEGATIVE  NEGATIVE mg/dL   Hgb urine dipstick NEGATIVE  NEGATIVE   Bilirubin Urine NEGATIVE  NEGATIVE   Ketones, ur NEGATIVE  NEGATIVE mg/dL   Protein, ur NEGATIVE  NEGATIVE mg/dL   Urobilinogen, UA 0.2  0.0 - 1.0 mg/dL   Nitrite NEGATIVE  NEGATIVE   Leukocytes, UA NEGATIVE  NEGATIVE  CBC     Status: Abnormal   Collection Time    05/19/13  7:00 PM      Result Value Range   WBC 10.7 (*) 4.0 - 10.5 K/uL   RBC 3.74 (*) 3.87 - 5.11 MIL/uL   Hemoglobin 9.3 (*) 12.0 - 15.0 g/dL   HCT 41.3 (*) 24.4 - 01.0 %   MCV 77.3 (*) 78.0 - 100.0 fL   MCH 24.9 (*) 26.0 - 34.0 pg   MCHC 32.2  30.0 - 36.0 g/dL   RDW 27.2 (*) 53.6 - 64.4 %   Platelets 287  150 - 400 K/uL  BASIC METABOLIC PANEL     Status: Abnormal   Collection Time    05/19/13  7:00 PM      Result Value Range   Sodium 133 (*) 135 - 145 mEq/L   Potassium 3.6  3.5 - 5.1 mEq/L   Chloride 103  96 - 112 mEq/L   CO2 20  19 - 32 mEq/L   Glucose, Bld 98  70 - 99 mg/dL   BUN 7  6 - 23 mg/dL   Creatinine, Ser 0.34  0.50 - 1.10 mg/dL   Calcium 8.6  8.4 - 74.2 mg/dL   GFR calc non Af Amer >90  >90 mL/min   GFR calc Af Amer >90  >90 mL/min   A:  32 yo G2P1001 at [redacted]w[redacted]d wks IUP Dizziness Secondary to Anemia  Plan: Discharge to home RX Integra Consume iron rich foods Keep scheduled appointment with Clearance Coots on 05/23/13  [facility]

## 2013-05-19 NOTE — MAU Note (Signed)
Reports having a sm bit of water and some milk to drink today.

## 2013-05-20 ENCOUNTER — Ambulatory Visit (INDEPENDENT_AMBULATORY_CARE_PROVIDER_SITE_OTHER): Payer: 59 | Admitting: Obstetrics

## 2013-05-20 VITALS — BP 120/93 | Temp 98.2°F | Wt 299.0 lb

## 2013-05-20 DIAGNOSIS — Z3483 Encounter for supervision of other normal pregnancy, third trimester: Secondary | ICD-10-CM

## 2013-05-20 DIAGNOSIS — Z3480 Encounter for supervision of other normal pregnancy, unspecified trimester: Secondary | ICD-10-CM

## 2013-05-20 LAB — POCT URINALYSIS DIPSTICK
Bilirubin, UA: NEGATIVE
Glucose, UA: NEGATIVE
Ketones, UA: NEGATIVE
Nitrite, UA: NEGATIVE
Spec Grav, UA: 1.015
pH, UA: 7

## 2013-05-20 NOTE — Progress Notes (Signed)
HR - 115 Routine OB visit, pt was in the emergency room last night with dehydration, IV access was negative, states she is increasing her fluid intake, states she get dizzy at times even when sitting down, started eleven am yesterday, having pressure in her pelvis.

## 2013-05-21 ENCOUNTER — Encounter: Payer: Self-pay | Admitting: Obstetrics

## 2013-05-23 ENCOUNTER — Encounter: Payer: 59 | Admitting: Obstetrics

## 2013-05-27 ENCOUNTER — Ambulatory Visit (INDEPENDENT_AMBULATORY_CARE_PROVIDER_SITE_OTHER): Payer: 59 | Admitting: Obstetrics

## 2013-05-27 VITALS — BP 133/80 | Temp 98.6°F | Wt 302.0 lb

## 2013-05-27 DIAGNOSIS — Z3480 Encounter for supervision of other normal pregnancy, unspecified trimester: Secondary | ICD-10-CM

## 2013-05-27 DIAGNOSIS — Z3483 Encounter for supervision of other normal pregnancy, third trimester: Secondary | ICD-10-CM

## 2013-05-27 LAB — POCT URINALYSIS DIPSTICK
Bilirubin, UA: NEGATIVE
Glucose, UA: NEGATIVE
Ketones, UA: NEGATIVE
Leukocytes, UA: NEGATIVE
Nitrite, UA: NEGATIVE
Spec Grav, UA: 1.015
Urobilinogen, UA: NEGATIVE

## 2013-05-27 NOTE — Progress Notes (Signed)
P 114 Patient thinks she had contractions over the weekend. Patient needs to rest more.

## 2013-05-28 ENCOUNTER — Other Ambulatory Visit: Payer: Self-pay | Admitting: *Deleted

## 2013-05-28 ENCOUNTER — Encounter: Payer: Self-pay | Admitting: Obstetrics

## 2013-05-29 ENCOUNTER — Other Ambulatory Visit: Payer: Medicaid Other

## 2013-05-29 DIAGNOSIS — Z3483 Encounter for supervision of other normal pregnancy, third trimester: Secondary | ICD-10-CM

## 2013-05-30 LAB — CREATININE, URINE, 24 HOUR
Creatinine, 24H Ur: 2093 mg/d — ABNORMAL HIGH (ref 700–1800)
Creatinine, Urine: 123.1 mg/dL

## 2013-05-30 LAB — PROTEIN, URINE, 24 HOUR
Protein, 24H Urine: 170 mg/d — ABNORMAL HIGH (ref 50–100)
Protein, Urine: 10 mg/dL

## 2013-06-04 ENCOUNTER — Ambulatory Visit (INDEPENDENT_AMBULATORY_CARE_PROVIDER_SITE_OTHER): Payer: 59 | Admitting: Obstetrics

## 2013-06-04 VITALS — BP 101/69 | Temp 98.6°F | Wt 300.0 lb

## 2013-06-04 DIAGNOSIS — Z3483 Encounter for supervision of other normal pregnancy, third trimester: Secondary | ICD-10-CM

## 2013-06-04 DIAGNOSIS — Z3480 Encounter for supervision of other normal pregnancy, unspecified trimester: Secondary | ICD-10-CM

## 2013-06-04 LAB — POCT URINALYSIS DIPSTICK
Blood, UA: NEGATIVE
Glucose, UA: NEGATIVE
Spec Grav, UA: 1.015
Urobilinogen, UA: NEGATIVE
pH, UA: 7

## 2013-06-04 NOTE — Progress Notes (Signed)
HR - 101 Pt in office for routine OB visit, would like to know results of 24 hr urine

## 2013-06-05 ENCOUNTER — Encounter: Payer: Self-pay | Admitting: Obstetrics

## 2013-06-11 ENCOUNTER — Encounter: Payer: Self-pay | Admitting: Obstetrics

## 2013-06-11 ENCOUNTER — Ambulatory Visit (INDEPENDENT_AMBULATORY_CARE_PROVIDER_SITE_OTHER): Payer: 59 | Admitting: Obstetrics

## 2013-06-11 VITALS — BP 100/68 | Temp 98.4°F | Wt 301.0 lb

## 2013-06-11 DIAGNOSIS — Z3483 Encounter for supervision of other normal pregnancy, third trimester: Secondary | ICD-10-CM

## 2013-06-11 DIAGNOSIS — Z3480 Encounter for supervision of other normal pregnancy, unspecified trimester: Secondary | ICD-10-CM

## 2013-06-11 LAB — POCT URINALYSIS DIPSTICK
Bilirubin, UA: NEGATIVE
Ketones, UA: NEGATIVE
Protein, UA: NEGATIVE
Spec Grav, UA: 1.015
pH, UA: 7

## 2013-06-11 NOTE — Progress Notes (Signed)
HR -112 Pt in office today for routine OB visit, pt states she is fatigued , complains of legs aching

## 2013-06-11 NOTE — Addendum Note (Signed)
Addended by: Ashley Royalty on: 06/11/2013 02:23 PM   Modules accepted: Orders

## 2013-06-18 ENCOUNTER — Ambulatory Visit (INDEPENDENT_AMBULATORY_CARE_PROVIDER_SITE_OTHER): Payer: 59 | Admitting: Obstetrics

## 2013-06-18 VITALS — BP 117/70 | Temp 97.4°F | Wt 302.0 lb

## 2013-06-18 DIAGNOSIS — Z3483 Encounter for supervision of other normal pregnancy, third trimester: Secondary | ICD-10-CM

## 2013-06-18 DIAGNOSIS — O4700 False labor before 37 completed weeks of gestation, unspecified trimester: Secondary | ICD-10-CM

## 2013-06-18 DIAGNOSIS — Z3480 Encounter for supervision of other normal pregnancy, unspecified trimester: Secondary | ICD-10-CM

## 2013-06-18 LAB — POCT URINALYSIS DIPSTICK
Bilirubin, UA: NEGATIVE
Blood, UA: NEGATIVE
Glucose, UA: NEGATIVE
Nitrite, UA: NEGATIVE
Spec Grav, UA: 1.015
Urobilinogen, UA: NEGATIVE

## 2013-06-18 NOTE — Progress Notes (Signed)
Pulse 117  Pt states that she is having lots of pain and pressure in lower abdomen and back.  Pt states that she has had an episode of nose bleeds when blowing her nose and coughing.

## 2013-06-19 ENCOUNTER — Encounter: Payer: Self-pay | Admitting: Obstetrics

## 2013-06-25 ENCOUNTER — Ambulatory Visit (INDEPENDENT_AMBULATORY_CARE_PROVIDER_SITE_OTHER): Payer: 59 | Admitting: Obstetrics

## 2013-06-25 ENCOUNTER — Encounter: Payer: Self-pay | Admitting: Obstetrics

## 2013-06-25 VITALS — BP 117/80 | Temp 97.7°F | Wt 302.0 lb

## 2013-06-25 DIAGNOSIS — R52 Pain, unspecified: Secondary | ICD-10-CM

## 2013-06-25 DIAGNOSIS — Z3483 Encounter for supervision of other normal pregnancy, third trimester: Secondary | ICD-10-CM

## 2013-06-25 DIAGNOSIS — O4700 False labor before 37 completed weeks of gestation, unspecified trimester: Secondary | ICD-10-CM

## 2013-06-25 DIAGNOSIS — B373 Candidiasis of vulva and vagina: Secondary | ICD-10-CM

## 2013-06-25 DIAGNOSIS — Z3480 Encounter for supervision of other normal pregnancy, unspecified trimester: Secondary | ICD-10-CM

## 2013-06-25 LAB — POCT URINALYSIS DIPSTICK
Glucose, UA: NEGATIVE
Ketones, UA: NEGATIVE
Urobilinogen, UA: NEGATIVE

## 2013-06-25 MED ORDER — TRAMADOL HCL 50 MG PO TABS: 100.0000 mg | ORAL_TABLET | Freq: Four times a day (QID) | ORAL | Status: DC | PRN

## 2013-06-25 MED ORDER — FLUCONAZOLE 100 MG PO TABS: 100.0000 mg | ORAL_TABLET | Freq: Every day | ORAL | Status: DC

## 2013-06-25 NOTE — Progress Notes (Signed)
Pulse- 120 Pt states she is having pelvic pressure.

## 2013-07-02 ENCOUNTER — Ambulatory Visit (INDEPENDENT_AMBULATORY_CARE_PROVIDER_SITE_OTHER): Payer: 59 | Admitting: Obstetrics

## 2013-07-02 VITALS — BP 122/78 | Temp 98.3°F | Wt 302.0 lb

## 2013-07-02 DIAGNOSIS — O4700 False labor before 37 completed weeks of gestation, unspecified trimester: Secondary | ICD-10-CM

## 2013-07-02 DIAGNOSIS — Z3483 Encounter for supervision of other normal pregnancy, third trimester: Secondary | ICD-10-CM

## 2013-07-02 DIAGNOSIS — Z3480 Encounter for supervision of other normal pregnancy, unspecified trimester: Secondary | ICD-10-CM

## 2013-07-02 LAB — POCT URINALYSIS DIPSTICK
Leukocytes, UA: NEGATIVE
Nitrite, UA: NEGATIVE
pH, UA: 7

## 2013-07-02 NOTE — Progress Notes (Signed)
Pulse 108 Pt states that she has been nauseated for the past couple of days. No changes in diet or medications.

## 2013-07-03 ENCOUNTER — Encounter: Payer: Self-pay | Admitting: Obstetrics

## 2013-07-08 ENCOUNTER — Encounter (HOSPITAL_COMMUNITY): Payer: Self-pay | Admitting: Pharmacist

## 2013-07-09 ENCOUNTER — Ambulatory Visit (INDEPENDENT_AMBULATORY_CARE_PROVIDER_SITE_OTHER): Payer: 59 | Admitting: Obstetrics

## 2013-07-09 ENCOUNTER — Encounter: Payer: Self-pay | Admitting: Obstetrics

## 2013-07-09 VITALS — BP 122/81 | Temp 98.1°F | Wt 304.0 lb

## 2013-07-09 DIAGNOSIS — Z3480 Encounter for supervision of other normal pregnancy, unspecified trimester: Secondary | ICD-10-CM

## 2013-07-09 DIAGNOSIS — Z3483 Encounter for supervision of other normal pregnancy, third trimester: Secondary | ICD-10-CM

## 2013-07-09 DIAGNOSIS — O4700 False labor before 37 completed weeks of gestation, unspecified trimester: Secondary | ICD-10-CM

## 2013-07-09 DIAGNOSIS — O0990 Supervision of high risk pregnancy, unspecified, unspecified trimester: Secondary | ICD-10-CM

## 2013-07-09 LAB — POCT URINALYSIS DIPSTICK
Blood, UA: NEGATIVE
Leukocytes, UA: NEGATIVE
Nitrite, UA: NEGATIVE
Urobilinogen, UA: NEGATIVE
pH, UA: 7

## 2013-07-09 NOTE — Progress Notes (Signed)
Pulse 112 Pt states contractions have gotten more frequent.

## 2013-07-09 NOTE — Progress Notes (Signed)
NST Reactive

## 2013-07-10 ENCOUNTER — Encounter (HOSPITAL_COMMUNITY)
Admission: RE | Admit: 2013-07-10 | Discharge: 2013-07-10 | Disposition: A | Discharge status: Home / Self Care | Payer: 59 | Source: Ambulatory Visit | Attending: Obstetrics | Admitting: Obstetrics

## 2013-07-10 ENCOUNTER — Encounter (HOSPITAL_COMMUNITY): Payer: Self-pay

## 2013-07-10 DIAGNOSIS — Z01812 Encounter for preprocedural laboratory examination: Secondary | ICD-10-CM | POA: Insufficient documentation

## 2013-07-10 HISTORY — DX: Anemia, unspecified: D64.9

## 2013-07-10 HISTORY — DX: Gastro-esophageal reflux disease without esophagitis: K21.9

## 2013-07-10 LAB — CBC
MCH: 23.9 pg — ABNORMAL LOW (ref 26.0–34.0)
MCHC: 31.9 g/dL (ref 30.0–36.0)
Platelets: 260 10*3/uL (ref 150–400)
RDW: 16.5 % — ABNORMAL HIGH (ref 11.5–15.5)
WBC: 9.3 10*3/uL (ref 4.0–10.5)

## 2013-07-10 LAB — TYPE AND SCREEN
ABO/RH(D): O POS
Antibody Screen: NEGATIVE

## 2013-07-10 LAB — ABO/RH: ABO/RH(D): O POS

## 2013-07-10 NOTE — Patient Instructions (Signed)
Your procedure is scheduled on: 07/12/2013  Enter through the Main Entrance of [facility] at: 1030AM  Pick up the phone at the desk and dial [phone].  Call this number if you have problems the morning of surgery: [phone].  Remember: Do NOT eat food: AFTER MIDNIGHT THURSDAY Do NOT drink clear liquids after: AFTER 8:00 AM DAY OF SURGERY Take these medicines the morning of surgery with a SIP OF WATER: PRILOSEC  Do NOT wear jewelry (body piercing), make-up, or nail polish. Do NOT wear lotions, powders, or perfumes.  You may wear deoderant. Do NOT shave for 48 hours prior to surgery. Do NOT bring valuables to the hospital. Contacts, dentures, or bridgework may not be worn into surgery. Leave suitcase in car.  After surgery it may be brought to your room.  For patients admitted to the hospital, checkout time is 11:00 AM the day of discharge.

## 2013-07-11 MED ORDER — SCOPOLAMINE 1 MG/3DAYS TD PT72
1.0000 | MEDICATED_PATCH | Freq: Once | TRANSDERMAL | Status: AC
  Administered 2013-07-12: 1.5 mg via TRANSDERMAL

## 2013-07-11 MED ORDER — GENTAMICIN SULFATE 40 MG/ML IJ SOLN
INTRAVENOUS | Status: AC
  Administered 2013-07-12: 100 mL via INTRAVENOUS
  Filled 2013-07-11: qty 10.5

## 2013-07-12 ENCOUNTER — Encounter (HOSPITAL_COMMUNITY): Payer: Self-pay | Admitting: Anesthesiology

## 2013-07-12 ENCOUNTER — Encounter (HOSPITAL_COMMUNITY): Payer: 59 | Admitting: Anesthesiology

## 2013-07-12 ENCOUNTER — Inpatient Hospital Stay (HOSPITAL_COMMUNITY)
Admission: RE | Admit: 2013-07-12 | Discharge: 2013-07-15 | DRG: 766 | Disposition: A | Discharge status: Home / Self Care | Payer: 59 | Source: Ambulatory Visit | Attending: Obstetrics | Admitting: Obstetrics

## 2013-07-12 ENCOUNTER — Encounter (HOSPITAL_COMMUNITY)
Admission: RE | Disposition: A | Discharge status: Home / Self Care | Payer: Self-pay | Source: Ambulatory Visit | Attending: Obstetrics

## 2013-07-12 ENCOUNTER — Inpatient Hospital Stay (HOSPITAL_COMMUNITY): Payer: 59 | Admitting: Anesthesiology

## 2013-07-12 DIAGNOSIS — D509 Iron deficiency anemia, unspecified: Secondary | ICD-10-CM | POA: Diagnosis present

## 2013-07-12 DIAGNOSIS — Z302 Encounter for sterilization: Secondary | ICD-10-CM

## 2013-07-12 DIAGNOSIS — O9902 Anemia complicating childbirth: Secondary | ICD-10-CM | POA: Diagnosis present

## 2013-07-12 DIAGNOSIS — O34219 Maternal care for unspecified type scar from previous cesarean delivery: Principal | ICD-10-CM | POA: Diagnosis present

## 2013-07-12 SURGERY — Surgical Case
Anesthesia: Spinal | Site: Abdomen | Laterality: Bilateral

## 2013-07-12 MED ORDER — OXYTOCIN 10 UNIT/ML IJ SOLN
40.0000 [IU] | INTRAVENOUS | Status: DC | PRN
  Administered 2013-07-12: 40 [IU] via INTRAVENOUS

## 2013-07-12 MED ORDER — WITCH HAZEL-GLYCERIN EX PADS: 1.0000 "application " | MEDICATED_PAD | CUTANEOUS | Status: DC | PRN

## 2013-07-12 MED ORDER — NALOXONE HCL 0.4 MG/ML IJ SOLN: 0.4000 mg | INTRAMUSCULAR | Status: DC | PRN

## 2013-07-12 MED ORDER — FERROUS SULFATE 325 (65 FE) MG PO TABS
325.0000 mg | ORAL_TABLET | Freq: Two times a day (BID) | ORAL | Status: DC
  Administered 2013-07-12 – 2013-07-15 (×5): 325 mg via ORAL
  Filled 2013-07-12 (×9): qty 1

## 2013-07-12 MED ORDER — KETOROLAC TROMETHAMINE 30 MG/ML IJ SOLN
INTRAMUSCULAR | Status: AC
  Filled 2013-07-12: qty 1

## 2013-07-12 MED ORDER — MAGNESIUM HYDROXIDE 400 MG/5ML PO SUSP
30.0000 mL | ORAL | Status: DC | PRN
  Filled 2013-07-12: qty 30

## 2013-07-12 MED ORDER — ONDANSETRON HCL 4 MG PO TABS: 4.0000 mg | ORAL_TABLET | ORAL | Status: DC | PRN

## 2013-07-12 MED ORDER — OXYCODONE-ACETAMINOPHEN 5-325 MG PO TABS
1.0000 | ORAL_TABLET | ORAL | Status: DC | PRN
  Administered 2013-07-13: 1 via ORAL
  Administered 2013-07-13: 2 via ORAL
  Administered 2013-07-13 (×2): 1 via ORAL
  Administered 2013-07-13 – 2013-07-14 (×6): 2 via ORAL
  Administered 2013-07-15 (×2): 1 via ORAL
  Filled 2013-07-12 (×2): qty 2
  Filled 2013-07-12 (×4): qty 1
  Filled 2013-07-12 (×5): qty 2
  Filled 2013-07-12: qty 1

## 2013-07-12 MED ORDER — PHENYLEPHRINE 8 MG IN D5W 100 ML (0.08MG/ML) PREMIX OPTIME
INJECTION | INTRAVENOUS | Status: AC
  Filled 2013-07-12: qty 100

## 2013-07-12 MED ORDER — NALBUPHINE SYRINGE 5 MG/0.5 ML
5.0000 mg | INJECTION | INTRAMUSCULAR | Status: DC | PRN
  Filled 2013-07-12: qty 1

## 2013-07-12 MED ORDER — DIPHENHYDRAMINE HCL 25 MG PO CAPS
25.0000 mg | ORAL_CAPSULE | Freq: Four times a day (QID) | ORAL | Status: DC | PRN
  Filled 2013-07-12: qty 1

## 2013-07-12 MED ORDER — LACTATED RINGERS IV SOLN
INTRAVENOUS | Status: DC | PRN
  Administered 2013-07-12: 12:00:00 via INTRAVENOUS

## 2013-07-12 MED ORDER — FENTANYL CITRATE 0.05 MG/ML IJ SOLN
INTRAMUSCULAR | Status: DC | PRN
  Administered 2013-07-12: 25 ug via INTRATHECAL

## 2013-07-12 MED ORDER — ONDANSETRON HCL 4 MG/2ML IJ SOLN
INTRAMUSCULAR | Status: DC | PRN
  Administered 2013-07-12: 4 mg via INTRAVENOUS

## 2013-07-12 MED ORDER — SIMETHICONE 80 MG PO CHEW
80.0000 mg | CHEWABLE_TABLET | ORAL | Status: DC | PRN
  Administered 2013-07-12: 80 mg via ORAL
  Filled 2013-07-12 (×2): qty 1

## 2013-07-12 MED ORDER — ONDANSETRON HCL 4 MG/2ML IJ SOLN: 4.0000 mg | INTRAMUSCULAR | Status: DC | PRN

## 2013-07-12 MED ORDER — BUPIVACAINE HCL (PF) 0.25 % IJ SOLN
INTRAMUSCULAR | Status: AC
  Filled 2013-07-12: qty 30

## 2013-07-12 MED ORDER — ONDANSETRON HCL 4 MG/2ML IJ SOLN
INTRAMUSCULAR | Status: AC
  Filled 2013-07-12: qty 2

## 2013-07-12 MED ORDER — MEASLES, MUMPS & RUBELLA VAC ~~LOC~~ INJ
0.5000 mL | INJECTION | Freq: Once | SUBCUTANEOUS | Status: DC
  Filled 2013-07-12: qty 0.5

## 2013-07-12 MED ORDER — SENNOSIDES-DOCUSATE SODIUM 8.6-50 MG PO TABS
2.0000 | ORAL_TABLET | ORAL | Status: DC
  Administered 2013-07-13 – 2013-07-14 (×3): 2 via ORAL
  Filled 2013-07-12 (×5): qty 2

## 2013-07-12 MED ORDER — DIPHENHYDRAMINE HCL 50 MG/ML IJ SOLN: 25.0000 mg | INTRAMUSCULAR | Status: DC | PRN

## 2013-07-12 MED ORDER — LACTATED RINGERS IV SOLN: INTRAVENOUS | Status: DC

## 2013-07-12 MED ORDER — DIPHENHYDRAMINE HCL 25 MG PO CAPS
25.0000 mg | ORAL_CAPSULE | ORAL | Status: DC | PRN
  Filled 2013-07-12: qty 1

## 2013-07-12 MED ORDER — NALOXONE HCL 1 MG/ML IJ SOLN
1.0000 ug/kg/h | INTRAVENOUS | Status: DC | PRN
  Filled 2013-07-12: qty 2

## 2013-07-12 MED ORDER — SODIUM CHLORIDE 0.9 % IJ SOLN: 3.0000 mL | INTRAMUSCULAR | Status: DC | PRN

## 2013-07-12 MED ORDER — DIBUCAINE 1 % RE OINT
1.0000 "application " | TOPICAL_OINTMENT | RECTAL | Status: DC | PRN
  Filled 2013-07-12: qty 28

## 2013-07-12 MED ORDER — LACTATED RINGERS IV SOLN
Freq: Once | INTRAVENOUS | Status: AC
  Administered 2013-07-12 (×3): via INTRAVENOUS

## 2013-07-12 MED ORDER — PRENATAL MULTIVITAMIN CH
1.0000 | ORAL_TABLET | Freq: Every day | ORAL | Status: DC
  Administered 2013-07-12 – 2013-07-15 (×4): 1 via ORAL
  Filled 2013-07-12 (×6): qty 1

## 2013-07-12 MED ORDER — SCOPOLAMINE 1 MG/3DAYS TD PT72: 1.0000 | MEDICATED_PATCH | Freq: Once | TRANSDERMAL | Status: DC

## 2013-07-12 MED ORDER — ACETAMINOPHEN 500 MG PO TABS
1000.0000 mg | ORAL_TABLET | Freq: Once | ORAL | Status: AC
  Administered 2013-07-12: 1000 mg via ORAL
  Filled 2013-07-12: qty 2

## 2013-07-12 MED ORDER — MEPERIDINE HCL 25 MG/ML IJ SOLN: 6.2500 mg | INTRAMUSCULAR | Status: DC | PRN

## 2013-07-12 MED ORDER — MORPHINE SULFATE (PF) 0.5 MG/ML IJ SOLN
INTRAMUSCULAR | Status: DC | PRN
  Administered 2013-07-12: .15 mg via INTRATHECAL

## 2013-07-12 MED ORDER — KETOROLAC TROMETHAMINE 30 MG/ML IJ SOLN
30.0000 mg | Freq: Four times a day (QID) | INTRAMUSCULAR | Status: AC | PRN
  Administered 2013-07-12: 30 mg via INTRAVENOUS
  Filled 2013-07-12: qty 1

## 2013-07-12 MED ORDER — OXYTOCIN 40 UNITS IN LACTATED RINGERS INFUSION - SIMPLE MED: 62.5000 mL/h | INTRAVENOUS | Status: AC

## 2013-07-12 MED ORDER — IBUPROFEN 600 MG PO TABS
600.0000 mg | ORAL_TABLET | Freq: Four times a day (QID) | ORAL | Status: DC
  Administered 2013-07-13 – 2013-07-15 (×11): 600 mg via ORAL
  Filled 2013-07-12 (×12): qty 1

## 2013-07-12 MED ORDER — SIMETHICONE 80 MG PO CHEW
80.0000 mg | CHEWABLE_TABLET | ORAL | Status: DC
  Administered 2013-07-13 – 2013-07-14 (×2): 80 mg via ORAL
  Filled 2013-07-12 (×6): qty 1

## 2013-07-12 MED ORDER — TETANUS-DIPHTH-ACELL PERTUSSIS 5-2.5-18.5 LF-MCG/0.5 IM SUSP
0.5000 mL | Freq: Once | INTRAMUSCULAR | Status: AC
  Administered 2013-07-13: 0.5 mL via INTRAMUSCULAR
  Filled 2013-07-12 (×2): qty 0.5

## 2013-07-12 MED ORDER — PHENYLEPHRINE 8 MG IN D5W 100 ML (0.08MG/ML) PREMIX OPTIME
INJECTION | INTRAVENOUS | Status: DC | PRN
  Administered 2013-07-12: 60 ug/min via INTRAVENOUS

## 2013-07-12 MED ORDER — NALBUPHINE SYRINGE 5 MG/0.5 ML
5.0000 mg | INJECTION | INTRAMUSCULAR | Status: DC | PRN
  Administered 2013-07-12: 10 mg via SUBCUTANEOUS
  Filled 2013-07-12 (×2): qty 1

## 2013-07-12 MED ORDER — LACTATED RINGERS IV SOLN
INTRAVENOUS | Status: DC
  Administered 2013-07-12: 23:00:00 via INTRAVENOUS

## 2013-07-12 MED ORDER — LANOLIN HYDROUS EX OINT: 1.0000 "application " | TOPICAL_OINTMENT | CUTANEOUS | Status: DC | PRN

## 2013-07-12 MED ORDER — METOCLOPRAMIDE HCL 5 MG/ML IJ SOLN: 10.0000 mg | Freq: Three times a day (TID) | INTRAMUSCULAR | Status: DC | PRN

## 2013-07-12 MED ORDER — ZOLPIDEM TARTRATE 5 MG PO TABS: 5.0000 mg | ORAL_TABLET | Freq: Every evening | ORAL | Status: DC | PRN

## 2013-07-12 MED ORDER — KETOROLAC TROMETHAMINE 30 MG/ML IJ SOLN: 30.0000 mg | Freq: Four times a day (QID) | INTRAMUSCULAR | Status: AC | PRN

## 2013-07-12 MED ORDER — KETOROLAC TROMETHAMINE 30 MG/ML IJ SOLN
30.0000 mg | Freq: Once | INTRAMUSCULAR | Status: AC
  Administered 2013-07-12: 30 mg via INTRAVENOUS

## 2013-07-12 MED ORDER — ONDANSETRON HCL 4 MG/2ML IJ SOLN: 4.0000 mg | Freq: Three times a day (TID) | INTRAMUSCULAR | Status: DC | PRN

## 2013-07-12 MED ORDER — DIPHENHYDRAMINE HCL 50 MG/ML IJ SOLN: 12.5000 mg | INTRAMUSCULAR | Status: DC | PRN

## 2013-07-12 MED ORDER — SCOPOLAMINE 1 MG/3DAYS TD PT72
MEDICATED_PATCH | TRANSDERMAL | Status: AC
  Filled 2013-07-12: qty 1

## 2013-07-12 MED ORDER — OXYTOCIN 10 UNIT/ML IJ SOLN
INTRAMUSCULAR | Status: AC
  Filled 2013-07-12: qty 4

## 2013-07-12 MED ORDER — MORPHINE SULFATE 0.5 MG/ML IJ SOLN
INTRAMUSCULAR | Status: AC
  Filled 2013-07-12: qty 10

## 2013-07-12 MED ORDER — FENTANYL CITRATE 0.05 MG/ML IJ SOLN: 25.0000 ug | INTRAMUSCULAR | Status: DC | PRN

## 2013-07-12 MED ORDER — FENTANYL CITRATE 0.05 MG/ML IJ SOLN
INTRAMUSCULAR | Status: AC
  Filled 2013-07-12: qty 2

## 2013-07-12 SURGICAL SUPPLY — 49 items
ADH SKN CLS APL DERMABOND .7 (GAUZE/BANDAGES/DRESSINGS) ×1
CANISTER WOUND CARE 500ML ATS (WOUND CARE) IMPLANT
CLAMP CORD UMBIL (MISCELLANEOUS) IMPLANT
CLOTH BEACON ORANGE TIMEOUT ST (SAFETY) ×2 IMPLANT
CONTAINER PREFILL 10% NBF 15ML (MISCELLANEOUS) ×4 IMPLANT
DERMABOND ADVANCED (GAUZE/BANDAGES/DRESSINGS) ×1
DERMABOND ADVANCED .7 DNX12 (GAUZE/BANDAGES/DRESSINGS) ×1 IMPLANT
DRAPE LG THREE QUARTER DISP (DRAPES) ×1 IMPLANT
DRSG OPSITE POSTOP 4X10 (GAUZE/BANDAGES/DRESSINGS) ×2 IMPLANT
DRSG VAC ATS LRG SENSATRAC (GAUZE/BANDAGES/DRESSINGS) IMPLANT
DRSG VAC ATS MED SENSATRAC (GAUZE/BANDAGES/DRESSINGS) IMPLANT
DRSG VAC ATS SM SENSATRAC (GAUZE/BANDAGES/DRESSINGS) IMPLANT
DURAPREP 26ML APPLICATOR (WOUND CARE) ×1 IMPLANT
ELECT REM PT RETURN 9FT ADLT (ELECTROSURGICAL) ×2
ELECTRODE REM PT RTRN 9FT ADLT (ELECTROSURGICAL) ×1 IMPLANT
EXTRACTOR VACUUM M CUP 4 TUBE (SUCTIONS) IMPLANT
GLOVE BIO SURGEON STRL SZ8 (GLOVE) ×2 IMPLANT
GLOVE BIOGEL PI IND STRL 7.5 (GLOVE) IMPLANT
GLOVE BIOGEL PI INDICATOR 7.5 (GLOVE) ×1
GLOVE SURG SS PI 7.0 STRL IVOR (GLOVE) ×1 IMPLANT
GOWN PREVENTION PLUS XLARGE (GOWN DISPOSABLE) ×2 IMPLANT
GOWN STRL REIN XL XLG (GOWN DISPOSABLE) ×4 IMPLANT
KIT ABG SYR 3ML LUER SLIP (SYRINGE) IMPLANT
NDL HYPO 25X5/8 SAFETYGLIDE (NEEDLE) ×1 IMPLANT
NEEDLE HYPO 22GX1.5 SAFETY (NEEDLE) ×1 IMPLANT
NEEDLE HYPO 25X5/8 SAFETYGLIDE (NEEDLE) IMPLANT
NS IRRIG 1000ML POUR BTL (IV SOLUTION) ×2 IMPLANT
PACK C SECTION WH (CUSTOM PROCEDURE TRAY) ×2 IMPLANT
PAD OB MATERNITY 4.3X12.25 (PERSONAL CARE ITEMS) ×2 IMPLANT
RTRCTR C-SECT PINK 25CM LRG (MISCELLANEOUS) ×2 IMPLANT
STAPLER VISISTAT 35W (STAPLE) IMPLANT
SUT GUT PLAIN 0 CT-3 TAN 27 (SUTURE) ×2 IMPLANT
SUT MNCRL 0 VIOLET CTX 36 (SUTURE) ×3 IMPLANT
SUT MNCRL AB 4-0 PS2 18 (SUTURE) IMPLANT
SUT MON AB 2-0 CT1 27 (SUTURE) ×2 IMPLANT
SUT MON AB 3-0 SH 27 (SUTURE)
SUT MON AB 3-0 SH27 (SUTURE) IMPLANT
SUT MONOCRYL 0 CTX 36 (SUTURE) ×3
SUT PDS AB 0 CTX 60 (SUTURE) ×2 IMPLANT
SUT PLAIN 2 0 XLH (SUTURE) ×1 IMPLANT
SUT VIC AB 0 CTX 36 (SUTURE)
SUT VIC AB 0 CTX36XBRD ANBCTRL (SUTURE) IMPLANT
SUT VIC AB 2-0 CT1 27 (SUTURE)
SUT VIC AB 2-0 CT1 TAPERPNT 27 (SUTURE) IMPLANT
SUT VIC AB 4-0 PS2 27 (SUTURE) ×1 IMPLANT
SYR CONTROL 10ML LL (SYRINGE) ×1 IMPLANT
TOWEL OR 17X24 6PK STRL BLUE (TOWEL DISPOSABLE) ×4 IMPLANT
TRAY FOLEY CATH 14FR (SET/KITS/TRAYS/PACK) ×2 IMPLANT
WATER STERILE IRR 1000ML POUR (IV SOLUTION) ×1 IMPLANT

## 2013-07-12 NOTE — Anesthesia Postprocedure Evaluation (Signed)
  Anesthesia Post-op Note  Patient: Brooke Chan  Procedure(s) Performed: Procedure(s): REPEAT CESAREAN SECTION WITH BILATERAL TUBAL LIGATION (Bilateral)  Patient is awake, responsive, moving her legs, and has signs of resolution of her numbness. Pain and nausea are reasonably well controlled. Vital signs are stable and clinically acceptable. Oxygen saturation is clinically acceptable. There are no apparent anesthetic complications at this time. Patient is ready for discharge.

## 2013-07-12 NOTE — Anesthesia Postprocedure Evaluation (Signed)
Anesthesia Post Note  Patient: Brooke Chan  Procedure(s) Performed: Procedure(s) (LRB): REPEAT CESAREAN SECTION WITH BILATERAL TUBAL LIGATION (Bilateral)  Anesthesia type: Spinal  Patient location: Mother/Baby  Post pain: Pain level controlled  Post assessment: Post-op Vital signs reviewed  Last Vitals:  Filed Vitals:   07/12/13 1406  BP: 127/81  Pulse: 92  Temp: 36.8 C  Resp: 16    Post vital signs: Reviewed  Level of consciousness: awake  Complications: No apparent anesthesia complications

## 2013-07-12 NOTE — H&P (Signed)
Brooke Chan is a 32 y.o. female presenting for repeat C/S. Maternal Medical History:  Reason for admission: 32 yo G2 P1.  EDC 07-15-13.  Presents for repeat C/S.  Fetal activity: Perceived fetal activity is normal.   Last perceived fetal movement was within the past hour.    Prenatal complications: no prenatal complications Prenatal Complications - Diabetes: none.    OB History   Grav Para Term Preterm Abortions TAB SAB Ect Mult Living   2 1 1  0 0 0 0 0 0 1     Past Medical History  Diagnosis Date  . Obese   . GERD (gastroesophageal reflux disease)   . Headache(784.0)     history migraine  . Anemia    Past Surgical History  Procedure Laterality Date  . Cesarean section     Family History: family history includes Asthma in her maternal grandmother; Brain cancer in an other family member; Breast cancer in her maternal aunt; Cancer in an other family member; Colitis in her mother; Congestive Heart Failure in her mother; Heart attack in her maternal grandmother; Heart disease in her maternal grandfather; Hyperlipidemia in her mother; Hypertension in her father; Kidney disease in an other family member; Prostate cancer in an other family member; Seizures in her mother. Social History:  reports that she has never smoked. She has never used smokeless tobacco. She reports that she does not drink alcohol or use illicit drugs.   Prenatal Transfer Tool  Maternal Diabetes: No Genetic Screening: Normal Maternal Ultrasounds/Referrals: Normal Fetal Ultrasounds or other Referrals:  None Maternal Substance Abuse:  No Significant Maternal Medications:  None Significant Maternal Lab Results:  None Other Comments:  None  Review of Systems  All other systems reviewed and are negative.      Blood pressure 97/57, pulse 114, temperature 98.4 F (36.9 C), resp. rate 20, last menstrual period 09/08/2012, SpO2 100.00%. Maternal Exam:  Abdomen: Patient reports no abdominal tenderness.    Physical Exam  Nursing note and vitals reviewed. Constitutional: She is oriented to person, place, and time. She appears well-developed and well-nourished.  HENT:  Head: Normocephalic and atraumatic.  Eyes: Conjunctivae are normal. Pupils are equal, round, and reactive to light.  Neck: Normal range of motion. Neck supple.  Cardiovascular: Normal rate and regular rhythm.   Respiratory: Effort normal and breath sounds normal.  GI: Soft.  Musculoskeletal: Normal range of motion.  Neurological: She is alert and oriented to person, place, and time.  Skin: Skin is warm and dry.  Psychiatric: She has a normal mood and affect. Her behavior is normal. Judgment and thought content normal.    Prenatal labs: ABO, Rh: --/--/O POS, O POS (12/17 1120) Antibody: NEG (12/17 1120) Rubella: 1.76 (06/04 1524) RPR: NON REACTIVE (12/17 1120)  HBsAg: NEGATIVE (06/04 1524)  HIV: NON REACTIVE (08/27 1332)  GBS: NEGATIVE (11/18 1446)   Assessment/Plan: 39.4 weeks.  Previous C/S.  Desires repeat C/S.   HARPER,CHARLES A 07/12/2013, 10:05 AM

## 2013-07-12 NOTE — Anesthesia Procedure Notes (Signed)
Epidural Patient location during procedure: OR  Preanesthetic Checklist Completed: patient identified, site marked, surgical consent, pre-op evaluation, timeout performed, IV checked, risks and benefits discussed and monitors and equipment checked  Epidural Patient position: sitting Prep: site prepped and draped and DuraPrep Patient monitoring: continuous pulse ox and blood pressure Approach: midline Injection technique: LOR air  Needle:  Needle type: Tuohy  Needle gauge: 17 G Needle length: 9 cm and 9 Needle insertion depth: 9 cm Catheter type: closed end flexible Catheter size: 19 Gauge Catheter at skin depth: 10 cm Test dose: negative  Assessment Events: blood not aspirated, injection not painful, no injection resistance, negative IV test and no paresthesia  Additional Notes Spinal Dosage in OR Tuohy LOR at 9cm; Sprotte thru Tuohy w/o difficulty. No catheter  Bupivacaine ml       1.6 PFMS04   mcg        150 Fentanyl mcg            25

## 2013-07-12 NOTE — Anesthesia Preprocedure Evaluation (Signed)
Anesthesia Evaluation  Patient identified by MRN, date of birth, ID band Patient awake    Reviewed: Allergy & Precautions, H&P , NPO status , Patient's Chart, lab work & pertinent test results  History of Anesthesia Complications (+) history of anesthetic complications  Airway Mallampati: III TM Distance: >3 FB Neck ROM: Full    Dental no notable dental hx. (+) Teeth Intact   Pulmonary neg pulmonary ROS,  breath sounds clear to auscultation  Pulmonary exam normal       Cardiovascular negative cardio ROS  Rhythm:Regular Rate:Normal     Neuro/Psych  Headaches, negative neurological ROS  negative psych ROS   GI/Hepatic negative GI ROS, Neg liver ROS, GERD-  Medicated and Controlled,  Endo/Other  Morbid obesity  Renal/GU negative Renal ROS     Musculoskeletal   Abdominal Normal abdominal exam  (+)   Peds  Hematology negative hematology ROS (+) anemia ,   Anesthesia Other Findings   Reproductive/Obstetrics (+) Pregnancy                           Anesthesia Physical Anesthesia Plan  ASA: III  Anesthesia Plan: Spinal   Post-op Pain Management:    Induction:   Airway Management Planned:   Additional Equipment:   Intra-op Plan:   Post-operative Plan:   Informed Consent: I have reviewed the patients History and Physical, chart, labs and discussed the procedure including the risks, benefits and alternatives for the proposed anesthesia with the patient or authorized representative who has indicated his/her understanding and acceptance.     Plan Discussed with: Anesthesiologist  Anesthesia Plan Comments:         Anesthesia Quick Evaluation

## 2013-07-12 NOTE — Preoperative (Signed)
Beta Blockers   Reason not to administer Beta Blockers:Not Applicable 

## 2013-07-12 NOTE — Transfer of Care (Signed)
Immediate Anesthesia Transfer of Care Note  Patient: Brooke Chan  Procedure(s) Performed: Procedure(s): REPEAT CESAREAN SECTION WITH BILATERAL TUBAL LIGATION (Bilateral)  Patient Location: PACU  Anesthesia Type:Spinal  Level of Consciousness: awake, alert  and oriented  Airway & Oxygen Therapy: Patient Spontanous Breathing  Post-op Assessment: Report given to PACU RN and Post -op Vital signs reviewed and stable  Post vital signs: Reviewed and stable  Complications: No apparent anesthesia complications

## 2013-07-12 NOTE — Op Note (Signed)
Cesarean Section Procedure Note   Brooke Chan   07/12/2013  Indications: Scheduled Proceedure/Maternal Request   Pre-operative Diagnosis: PREV C/S.  Desires Repeat C/S.  Desires Permanent Sterilization  Post-operative Diagnosis: Same   Surgeon: Coral Ceo A  Assistants: Antionette Char  Anesthesia: spinal  Procedure Details:  The patient was seen in the Holding Room. The risks, benefits, complications, treatment options, and expected outcomes were discussed with the patient. The patient concurred with the proposed plan, giving informed consent. The patient was identified as [name] and the procedure verified as C-Section Delivery. A Time Out was held and the above information confirmed.  After induction of anesthesia, the patient was draped and prepped in the usual sterile manner. A transverse incision was made and carried down through the subcutaneous tissue to the fascia. The fascial incision was made and extended transversely. The fascia was separated from the underlying rectus tissue superiorly and inferiorly. The peritoneum was identified and entered. The peritoneal incision was extended longitudinally. The utero-vesical peritoneal reflection was incised transversely and the bladder flap was bluntly freed from the lower uterine segment. A low transverse uterine incision was made. Delivered from cephalic presentation was a 3940 gram living newborn female infant(s). APGAR (1 MIN):   APGAR (5 MINS):   APGAR (10 MINS):    A cord ph was not sent. The umbilical cord was clamped and cut cord. A sample was obtained for evaluation. The placenta was removed Intact and appeared normal.  The uterine incision was closed with running locked sutures of 1-0 Monocryl. A second imbricating layer of the same suture was placed.  Hemostasis was observed.  Attention then turned to tubal procedure.  The right fallopian tube was grasped in the isthmic portion and the knuckle of tube beneath  the clamp was suture ligated through the mesosalpinx and the tubal segment above the knot was excised and submitted to Pathology.  Hemostasis observed.  The tubal stumps placed back in their normal anatomic position.  The same procedure performed on the opposite side without complications. The paracolic gutters were irrigated. The parieto peritoneum was closed in a running fashion with 2-0 Vicryl.  The fascia was then reapproximated with running sutures of 0 PDS.  The skin was closed with sutures.  Instrument, sponge, and needle counts were correct prior the abdominal closure and were correct at the conclusion of the case.    Findings:  Normal uterus, ovaries and tubes   Estimated Blood Loss:   Total IV Fluids:   Urine Output: 100CC OF clear urine  Specimens: ~ 2 cm segments of right and left fallopian tubes  Complications: no complications  Disposition: PACU - hemodynamically stable.  Maternal Condition: stable   Baby condition / location:  Couplet care / Skin to Skin    Signed: Surgeon(s): Brock Bad, MD

## 2013-07-13 LAB — CBC
Platelets: 225 10*3/uL (ref 150–400)
RBC: 3.26 MIL/uL — ABNORMAL LOW (ref 3.87–5.11)
RDW: 16.5 % — ABNORMAL HIGH (ref 11.5–15.5)
WBC: 10.6 10*3/uL — ABNORMAL HIGH (ref 4.0–10.5)

## 2013-07-13 LAB — BIRTH TISSUE RECOVERY COLLECTION (PLACENTA DONATION)

## 2013-07-13 NOTE — Progress Notes (Signed)
Pt ambulated to bathroom well, peri care done.  Upon standing up from toilet, pt stated she was fine but eyes were rolling and pt. Was drifting backwards.  Used Stedy to take pt back to bed.  Once back in bed pt stated she did feel dizzy but was afraid to sit back down on toilet because of pain.  Will continue to monitor.

## 2013-07-13 NOTE — Progress Notes (Signed)
Subjective: Postpartum Day 1: Cesarean Delivery Patient reports tolerating PO.    Objective: Vital signs in last 24 hours: Temp:  [97.6 F (36.4 C)-99.3 F (37.4 C)] 98.9 F (37.2 C) (12/20 0159) Pulse Rate:  [89-114] 105 (12/20 0541) Resp:  [14-20] 18 (12/20 0541) BP: (86-129)/(53-93) 114/66 mmHg (12/20 0541) SpO2:  [98 %-100 %] 98 % (12/20 0541) Weight:  [304 lb (137.893 kg)] 304 lb (137.893 kg) (12/19 1302)  Physical Exam:  General: alert and no distress Lochia: appropriate Uterine Fundus: firm Incision: healing well DVT Evaluation: No evidence of DVT seen on physical exam.   Recent Labs  07/10/13 1120  HGB 9.9*  HCT 31.0*    Assessment/Plan: Status post Cesarean section. Doing well postoperatively.  Continue current care.  HARPER,CHARLES A 07/13/2013, 6:10 AM

## 2013-07-13 NOTE — Lactation Note (Signed)
This note was copied from the chart of Brooke Chan. Lactation Consultation Note  Patient Name: Brooke Chan [id number] Date: 07/13/2013 Reason for consult: Follow-up assessment of this second-time mother and her newborn at 50 hours of age.  Mom continues expressing concern with whether baby is receiving enough milk and states she is nursing frequently.  Mom has fed formula by bottle once (15 ml's) and reports that baby slept for a long time after that feeding.  LC showed this mother the demo tummy balls so she could see how small a newborn stomach is at first and why frequent feedings of the rich colostrum needed.  LC also discussed rapid digestion and transit time for breast milk compared to formula and reinforced LEAD cautions regarding formula supplement. LC reviewed other comfort measures for a fussy baby and encouraged continued cue feedings for maximum milk supply.   Maternal Data    Feeding    LATCH Score/Interventions        LATCH score=9 at most recent feeding for 60 minutes              Lactation Tools Discussed/Used   Newborn stomach size Rapid breast milk digestion and need for cue and cluster feedings LEAD guidelines  Consult Status Consult Status: Follow-up Date: 07/13/13 Follow-up type: In-patient    [facility] 07/13/2013, 5:49 PM

## 2013-07-14 MED ORDER — MAGNESIUM HYDROXIDE 400 MG/5ML PO SUSP
30.0000 mL | Freq: Two times a day (BID) | ORAL | Status: DC
  Administered 2013-07-14: 30 mL via ORAL
  Filled 2013-07-14: qty 30

## 2013-07-14 MED ORDER — SIMETHICONE 80 MG PO CHEW
80.0000 mg | CHEWABLE_TABLET | Freq: Four times a day (QID) | ORAL | Status: DC
  Administered 2013-07-14 – 2013-07-15 (×3): 80 mg via ORAL
  Filled 2013-07-14 (×3): qty 1

## 2013-07-14 MED ORDER — DOCUSATE SODIUM 100 MG PO CAPS
100.0000 mg | ORAL_CAPSULE | Freq: Two times a day (BID) | ORAL | Status: DC
  Administered 2013-07-14 – 2013-07-15 (×2): 100 mg via ORAL
  Filled 2013-07-14 (×2): qty 1

## 2013-07-14 NOTE — Progress Notes (Signed)
Subjective: Postpartum Day 2: Cesarean Delivery Patient reports tolerating PO, + flatus and no problems voiding.    Objective: Vital signs in last 24 hours: Temp:  [99.5 F (37.5 C)] 99.5 F (37.5 C) (12/20 1741) Pulse Rate:  [105-108] 105 (12/20 1741) Resp:  [18-20] 18 (12/20 1741) BP: (99-114)/(66-72) 109/70 mmHg (12/20 1741) SpO2:  [98 %-99 %] 99 % (12/20 1100)  Physical Exam:  General: alert and no distress Lochia: appropriate Uterine Fundus: firm Incision: healing well DVT Evaluation: No evidence of DVT seen on physical exam.   Recent Labs  07/13/13 0619  HGB 7.8*  HCT 24.6*    Assessment/Plan: Status post Cesarean section. Doing well postoperatively.  Anemia.  Clinically stable. Continue current care.  Brooke Chan A 07/14/2013, 5:14 AM

## 2013-07-14 NOTE — Lactation Note (Signed)
This note was copied from the chart of Brooke Chan. Lactation Consultation Note  Patient Name: Brooke Chan [id number] Date: 07/14/2013 Reason for consult: Follow-up assessment;Other (Comment) (fussy baby).  LC [facility] Dondra Prader, [facility])  had assisted earlier today with latching using a #24 NS and mom reported a more comfortable latch and more satiety by baby after that feeding. LC attempted to visit for follow-up at feeding time but mom states that she breastfed baby for 20 minutes an hour ago and did not need to use NS.  She reports baby had strong/rhythmical sucks and is asleep now, showing satiety after feeding.  LC encouraged mom to feed on cue, use NS if necessary and call LC for further assistance as needed.   Maternal Data    Feeding Feeding Type: Breast Fed Length of feed: 20 min  LATCH Score/Interventions Latch: Repeated attempts needed to sustain latch, nipple held in mouth throughout feeding, stimulation needed to elicit sucking reflex. Intervention(s): Adjust position;Assist with latch;Breast compression  Audible Swallowing: A few with stimulation  Type of Nipple: Everted at rest and after stimulation  Comfort (Breast/Nipple): Soft / non-tender     Hold (Positioning): Assistance needed to correctly position infant at breast and maintain latch.  LATCH Score: 7  (previously assessed feeding documentation)  Lactation Tools Discussed/Used Tools: Nipple Shields Nipple shield size: 24 Initiated by:: LC.  RN to set-up Date initiated:: 07/14/13   Consult Status Consult Status: Follow-up Date: 07/15/13 Follow-up type: In-patient    Warrick Parisian [facility] 07/14/2013, 7:07 PM

## 2013-07-15 ENCOUNTER — Encounter (HOSPITAL_COMMUNITY): Payer: Self-pay | Admitting: Obstetrics

## 2013-07-15 MED ORDER — OXYCODONE-ACETAMINOPHEN 5-325 MG PO TABS: 1.0000 | ORAL_TABLET | ORAL | Status: DC | PRN

## 2013-07-15 MED ORDER — IBUPROFEN 600 MG PO TABS: 600.0000 mg | ORAL_TABLET | Freq: Four times a day (QID) | ORAL | Status: DC | PRN

## 2013-07-15 MED ORDER — FUSION PLUS PO CAPS: 1.0000 | ORAL_CAPSULE | Freq: Every day | ORAL | Status: DC

## 2013-07-15 NOTE — Discharge Summary (Signed)
Obstetric Discharge Summary Reason for Admission: cesarean section Prenatal Procedures: ultrasound Intrapartum Procedures: cesarean: low cervical, transverse  BTL Postpartum Procedures: none Complications-Operative and Postpartum: none Hemoglobin  Date Value Range Status  07/13/2013 7.8* 12.0 - 15.0 g/dL Final     HCT  Date Value Range Status  07/13/2013 24.6* 36.0 - 46.0 % Final    Physical Exam:  General: alert and no distress Lochia: appropriate Uterine Fundus: firm Incision: healing well DVT Evaluation: No evidence of DVT seen on physical exam.  Discharge Diagnoses: Term Pregnancy-delivered  Discharge Information: Date: 07/15/2013 Activity: pelvic rest Diet: routine Medications: PNV, Ibuprofen, Colace, Iron and Percocet Condition: stable Instructions: refer to practice specific booklet Discharge to: home Follow-up Information   Follow up with HARPER,CHARLES A, MD In 2 weeks.   Specialty:  Obstetrics and Gynecology   Contact information:   [address] [place] [address] [phone]       Newborn Data: Live born female  Birth Weight: 8 lb 11 oz (3940 g) APGAR: 9, 9  Home with mother.  HARPER,CHARLES A 07/15/2013, 5:10 AM

## 2013-07-15 NOTE — Progress Notes (Signed)
Subjective: Postpartum Day 3: Cesarean Delivery Patient reports tolerating PO, + flatus and no problems voiding.    Objective: Vital signs in last 24 hours: Temp:  [98.4 F (36.9 C)-98.6 F (37 C)] 98.6 F (37 C) (12/21 1755) Pulse Rate:  [101-109] 109 (12/21 1755) Resp:  [18-20] 20 (12/21 1755) BP: (110-117)/(64-69) 117/64 mmHg (12/21 1755)  Physical Exam:  General: alert and no distress Lochia: appropriate Uterine Fundus: firm Incision: healing well DVT Evaluation: No evidence of DVT seen on physical exam.   Recent Labs  07/13/13 0619  HGB 7.8*  HCT 24.6*    Assessment/Plan: Status post Cesarean section. Doing well postoperatively.   Anemia.  Chronic iron deficiency.  Clinically stable.  Iron Rx. Discharge home with standard precautions and return to clinic in 2 weeks.  Nikyla Navedo A 07/15/2013, 5:04 AM

## 2013-07-22 ENCOUNTER — Telehealth: Payer: Self-pay | Admitting: *Deleted

## 2013-07-22 NOTE — Telephone Encounter (Signed)
Patient call stating [facility] is requesting an official doctors statement on office letterhead. She state the initial statement of her conditions need to be placed on letter and faxed to [phone]. [employer] contact  Number is [phone].  Call placed to [employer] representative at [phone] [facility], He stated an official statement for procedures and services rendered are need on the office doctors statement and faxed to [phone], and Mr Falls stated that he would have the claims expedited.

## 2013-07-23 ENCOUNTER — Ambulatory Visit (INDEPENDENT_AMBULATORY_CARE_PROVIDER_SITE_OTHER): Payer: 59 | Admitting: Obstetrics

## 2013-07-23 ENCOUNTER — Encounter: Payer: Self-pay | Admitting: Obstetrics

## 2013-07-23 VITALS — BP 121/83 | HR 91 | Temp 98.4°F | Wt 283.0 lb

## 2013-07-23 DIAGNOSIS — O900 Disruption of cesarean delivery wound: Secondary | ICD-10-CM

## 2013-07-23 MED ORDER — SULFAMETHOXAZOLE-TMP DS 800-160 MG PO TABS: 1.0000 | ORAL_TABLET | Freq: Two times a day (BID) | ORAL | Status: DC

## 2013-07-23 NOTE — Progress Notes (Signed)
Subjective:     Brooke Chan is a 32 y.o. female who presents for a postpartum visit. She is 2 weeks postpartum following a low cervical transverse Cesarean section. I have fully reviewed the prenatal and intrapartum course. The delivery was at 39  gestational weeks. Outcome: repeat cesarean section, low transverse incision. Anesthesia: Spinal. Postpartum course has been going well.  Pt would like incision checked.  Pt is having more pain in the lower right side. Baby's course has been going well. Baby is feeding by both breast and bottle - Similac Advance. Bleeding moderate bleeding. Bowel function is normal. Bladder function is normal. Patient is not sexually active. Contraception method is tubal ligation. Postpartum depression screening: negative.  The following portions of the patient's history were reviewed and updated as appropriate: allergies, current medications, past family history, past medical history, past social history, past surgical history and problem list.  Review of Systems Pertinent items are noted in HPI.   Objective:    BP 121/83  Pulse 91  Temp(Src) 98.4 F (36.9 C)  Wt 283 lb (128.368 kg)  LMP 09/08/2012  Breastfeeding? Yes  General:  alert and no distress   Breasts:  inspection negative, no nipple discharge or bleeding, no masses or nodularity palpable Abdomen:  Incision disrupted in left corner.  No drainage.  Probed with Q Tip, then packed with moistened 1/4 inch Iodoform gauze strip, and bandage applied.   Assessment:    Post op wound disruption.  Stable.  Continue daily dressing changes.  Plan:    1. Contraception: BTL 2. Bactrim DS Rx. 3. Follow up in: 4 weeks or as needed.

## 2013-07-24 ENCOUNTER — Encounter: Payer: Self-pay | Admitting: Obstetrics

## 2013-07-24 ENCOUNTER — Encounter: Payer: Self-pay | Admitting: *Deleted

## 2013-07-24 ENCOUNTER — Ambulatory Visit (INDEPENDENT_AMBULATORY_CARE_PROVIDER_SITE_OTHER): Payer: 59 | Admitting: Obstetrics

## 2013-07-24 VITALS — BP 102/70 | HR 103 | Temp 98.5°F | Wt 285.0 lb

## 2013-07-24 DIAGNOSIS — O909 Complication of the puerperium, unspecified: Secondary | ICD-10-CM

## 2013-07-24 NOTE — Progress Notes (Signed)
Subjective:     Brooke Chan is a 32 y.o. female here for a incision exam.  Current complaints: pain controlled by ibuprofen .  Personal health questionnaire reviewed: yes.   Gynecologic History Patient's last menstrual period was 09/08/2012. Contraception: abstinence and tubal ligation Last Pap: 12/26/2012. Results were: normal Last mammogram: N/A  Obstetric History OB History  Gravida Para Term Preterm AB SAB TAB Ectopic Multiple Living  2 2 2  0 0 0 0 0 0 2    # Outcome Date GA Lbr Len/2nd Weight Sex Delivery Anes PTL Lv  2 TRM 07/12/13 [redacted]w[redacted]d   F LTCS Spinal  Y  1 TRM 11/22/00    M LTCS Spinal  Y       The following portions of the patient's history were reviewed and updated as appropriate: allergies, current medications, past family history, past medical history, past social history, past surgical history and problem list.  Review of Systems Pertinent items are noted in HPI.    Objective:    General appearance: alert and no distress Breasts: normal appearance, no masses or tenderness Abdomen: normal findings: soft, non-tender and staples removed and small area of disruption of incision with seroma.   The small cavity was drained and                   packed with 1/4 inch Iodoform gauze strip, and bandage applied.  Assessment:    Post op C/S.  Small wound disruption with seroma.  Wet to dry packing done with healing by 2nd Intention.   Plan:    Education reviewed: Management of wound disruption.. Contraception: tubal ligation. Follow up in: 1 day. Continue wet to dry dressing changes.  Daily.

## 2013-07-25 ENCOUNTER — Inpatient Hospital Stay (HOSPITAL_COMMUNITY)
Admission: AD | Admit: 2013-07-25 | Discharge: 2013-07-25 | Disposition: A | Discharge status: Home / Self Care | Payer: 59 | Source: Ambulatory Visit | Attending: Obstetrics & Gynecology | Admitting: Obstetrics & Gynecology

## 2013-07-25 DIAGNOSIS — Z4801 Encounter for change or removal of surgical wound dressing: Secondary | ICD-10-CM | POA: Insufficient documentation

## 2013-07-25 NOTE — Progress Notes (Signed)
Pt here for dressing change. Notified Dr. Clearance CootsHarper. Medical screen done by D. Poe,CNM. Wet to dry dressing with new gauze done. Pt tolerated well.

## 2013-07-26 ENCOUNTER — Encounter: Payer: Self-pay | Admitting: Advanced Practice Midwife

## 2013-07-26 ENCOUNTER — Encounter: Payer: Self-pay | Admitting: Obstetrics

## 2013-07-26 ENCOUNTER — Ambulatory Visit (INDEPENDENT_AMBULATORY_CARE_PROVIDER_SITE_OTHER): Payer: 59 | Admitting: Obstetrics

## 2013-07-26 VITALS — BP 101/73 | HR 103 | Temp 99.1°F | Ht 66.5 in | Wt 282.0 lb

## 2013-07-26 DIAGNOSIS — O900 Disruption of cesarean delivery wound: Secondary | ICD-10-CM

## 2013-07-26 DIAGNOSIS — O909 Complication of the puerperium, unspecified: Secondary | ICD-10-CM | POA: Insufficient documentation

## 2013-07-26 NOTE — Progress Notes (Signed)
Pt in office for wound care.  PE:      Abdomen:  Incision clean, NT.  Packing removed and replaced.  Bandage applied.  A/P:  Wound disruption after C/S.  Stable healing by 2nd Intension.  Continue daily dressing changes.

## 2013-07-27 ENCOUNTER — Encounter: Payer: Self-pay | Admitting: Obstetrics

## 2013-07-27 DIAGNOSIS — O900 Disruption of cesarean delivery wound: Secondary | ICD-10-CM | POA: Insufficient documentation

## 2013-07-30 ENCOUNTER — Ambulatory Visit (INDEPENDENT_AMBULATORY_CARE_PROVIDER_SITE_OTHER): Payer: 59 | Admitting: Obstetrics

## 2013-07-30 ENCOUNTER — Encounter: Payer: Self-pay | Admitting: Obstetrics

## 2013-07-30 ENCOUNTER — Encounter: Payer: Self-pay | Admitting: *Deleted

## 2013-07-30 VITALS — BP 100/68 | HR 93 | Temp 98.2°F | Ht 66.5 in | Wt 277.0 lb

## 2013-07-30 DIAGNOSIS — R52 Pain, unspecified: Secondary | ICD-10-CM

## 2013-07-30 DIAGNOSIS — O900 Disruption of cesarean delivery wound: Secondary | ICD-10-CM

## 2013-07-30 MED ORDER — OXYCODONE-ACETAMINOPHEN 5-325 MG PO TABS: 1.0000 | ORAL_TABLET | ORAL | Status: DC | PRN

## 2013-07-30 NOTE — Progress Notes (Signed)
Pt in office today for incision check, pt has home health for wound care, right side of incision opened yesterday with bleeding, yellow discharge and odor reported, increased pain, denies swelling warmth or fever to wound site  PE:        Wound open on right side now and packing is in place.  Packing removed and the area probed with a Q tip revealing a tunnel in the center.  The incision anesthetized with 1% Xylocaine and opened with scissors.  The wound then packed with moistened.  Kerlex gauze.   A/P:  Wound infection/disruption.  Will request wound vac for continued management.

## 2013-08-05 ENCOUNTER — Encounter: Payer: Self-pay | Admitting: Obstetrics

## 2013-08-06 ENCOUNTER — Ambulatory Visit (INDEPENDENT_AMBULATORY_CARE_PROVIDER_SITE_OTHER): Payer: 59 | Admitting: Obstetrics

## 2013-08-06 ENCOUNTER — Encounter: Payer: Self-pay | Admitting: Obstetrics

## 2013-08-06 VITALS — BP 107/73 | HR 110 | Temp 97.0°F | Ht 66.5 in | Wt 275.0 lb

## 2013-08-06 DIAGNOSIS — O909 Complication of the puerperium, unspecified: Secondary | ICD-10-CM

## 2013-08-06 NOTE — Progress Notes (Signed)
Subjective:     Brooke Chan is a 33 y.o. female who presents for a postpartum visit. She is 4 weeks postpartum following a low cervical transverse Cesarean section. I have fully reviewed the prenatal and intrapartum course. The delivery was at 39 gestational weeks. Outcome: repeat cesarean section, low transverse incision. Anesthesia: spinal. Postpartum course has been complicated by wound separation. Currently has a wound vac. Baby's course has been WNL. Baby is feeding by both breast and bottle - Similac Advance. Bleeding thin lochia. Bowel function is normal. Bladder function is normal. Patient is not sexually active. Contraception method is abstinence. Postpartum depression screening: negative.  The following portions of the patient's history were reviewed and updated as appropriate: allergies, current medications, past family history, past medical history, past social history, past surgical history and problem list.  Review of Systems Pertinent items are noted in HPI.   Objective:    BP 107/73  Pulse 110  Temp(Src) 97 F (36.1 C)  Ht 5' 6.5" (1.689 m)  Wt 275 lb (124.739 kg)  BMI 43.73 kg/m2  Breastfeeding? Yes  General:  alert and no distress Abdomen:  Soft, NT.  Wound Vac in place.   Assessment:    Post op wound infection.  Improved with wound vac.  Continue wound vac per protocol.  Plan:    1. Contraception: BTL 2. Continue po antibiotic 3. Follow up in: 2 weeks or as needed.

## 2013-08-13 ENCOUNTER — Encounter: Payer: Self-pay | Admitting: Obstetrics

## 2013-08-19 ENCOUNTER — Ambulatory Visit: Payer: 59 | Admitting: Obstetrics

## 2013-08-21 ENCOUNTER — Encounter: Payer: Self-pay | Admitting: Obstetrics

## 2013-08-21 ENCOUNTER — Ambulatory Visit (INDEPENDENT_AMBULATORY_CARE_PROVIDER_SITE_OTHER): Payer: 59 | Admitting: Obstetrics

## 2013-08-21 ENCOUNTER — Ambulatory Visit: Payer: 59 | Admitting: Obstetrics

## 2013-08-21 ENCOUNTER — Encounter: Payer: Self-pay | Admitting: *Deleted

## 2013-08-21 VITALS — BP 104/70 | HR 85 | Temp 98.1°F | Wt 275.0 lb

## 2013-08-21 DIAGNOSIS — O909 Complication of the puerperium, unspecified: Secondary | ICD-10-CM

## 2013-08-21 DIAGNOSIS — B3731 Acute candidiasis of vulva and vagina: Secondary | ICD-10-CM

## 2013-08-21 DIAGNOSIS — B373 Candidiasis of vulva and vagina: Secondary | ICD-10-CM

## 2013-08-21 MED ORDER — FLUCONAZOLE 150 MG PO TABS: 150.0000 mg | ORAL_TABLET | Freq: Once | ORAL | Status: DC

## 2013-08-21 NOTE — Addendum Note (Signed)
Addended by: Brock BadHARPER, Akiko Schexnider A on: 08/21/2013 02:46 PM   Modules accepted: Orders

## 2013-08-21 NOTE — Progress Notes (Signed)
Subjective:     Brooke Chan is a 33 y.o. female here for a follow up wound exam.  Current complaints: Pt states that incision is healing well.  Pt states that home nurse thinks wound vac may be able to be removed upon Dr Clearance CootsHarper suggestions.  Pt will f/u with home nurse after today's visit.  Pt states that she may have a yeast infection from antibiotic use.   Personal health questionnaire reviewed: yes.   Gynecologic History No LMP recorded.   Obstetric History OB History  Gravida Para Term Preterm AB SAB TAB Ectopic Multiple Living  2 2 2  0 0 0 0 0 0 2    # Outcome Date GA Lbr Len/2nd Weight Sex Delivery Anes PTL Lv  2 TRM 07/12/13 [id number]   F LTCS Spinal  Y  1 TRM 11/22/00    M LTCS Spinal  Y       The following portions of the patient's history were reviewed and updated as appropriate: allergies, current medications, past family history, past medical history, past social history, past surgical history and problem list.  Review of Systems Pertinent items are noted in HPI.    Objective:    No exam performed today, Wound Vac in place..    Assessment:    Wound infection / seroma.  S/P I&D and Wound Vac management.  Continue Wound Vac until closed.   Plan:    Follow up in: 2 weeks.

## 2013-09-03 ENCOUNTER — Ambulatory Visit (INDEPENDENT_AMBULATORY_CARE_PROVIDER_SITE_OTHER): Payer: 59 | Admitting: Obstetrics

## 2013-09-03 ENCOUNTER — Encounter: Payer: Self-pay | Admitting: Obstetrics

## 2013-09-03 VITALS — BP 113/81 | HR 108 | Temp 98.0°F | Ht 66.5 in | Wt 270.0 lb

## 2013-09-03 DIAGNOSIS — O900 Disruption of cesarean delivery wound: Secondary | ICD-10-CM

## 2013-09-03 NOTE — Progress Notes (Signed)
Subjective:     Brooke Chan is a 33 y.o. female who presents for a postpartum visit. She is 7 weeks postpartum following a low cervical transverse Cesarean section. I have fully reviewed the prenatal and intrapartum course. The delivery was at 39 gestational weeks. Outcome: primary cesarean section, low transverse incision. Anesthesia: Spinal. Postpartum course has been WNL. Baby's course has been WNL. Baby is feeding by breast. Bleeding no bleeding. Bowel function is Abnormal. Patient states she is having constipation, patient states has been taking milk of magnesia Bladder function is normal. Patient is not sexually active. Contraception method is abstinence and tubal ligation. Patient states she will have bleeding one week and then none the next, Patient states theres usually just enough there to know its there. Postpartum depression screening: negative.  The following portions of the patient's history were reviewed and updated as appropriate: allergies, current medications, past family history, past medical history, past social history, past surgical history and problem list.  Review of Systems Pertinent items are noted in HPI.   Objective:    Ht 5' 6.5" (1.689 m)  Wt 270 lb (122.471 kg)  BMI 42.93 kg/m2  Breastfeeding? Yes  General:  alert and no distress Breasts:  Negative Abdomen:  Incision clean, dry and almost closed, NT. Uterus NSSC, NT.   Assessment:     Normal postpartum exam. Pap smear not done at today's visit.   Plan:    1. Contraception: tubal ligation 2. Continue Wound Vac 3. Follow up in: 6 weeks or as needed.

## 2013-09-18 ENCOUNTER — Other Ambulatory Visit: Payer: Self-pay | Admitting: Obstetrics

## 2013-09-18 DIAGNOSIS — M25559 Pain in unspecified hip: Secondary | ICD-10-CM

## 2013-09-26 ENCOUNTER — Encounter: Payer: Self-pay | Admitting: Obstetrics

## 2013-09-30 ENCOUNTER — Encounter: Payer: Self-pay | Admitting: Obstetrics

## 2013-10-08 ENCOUNTER — Encounter: Payer: Self-pay | Admitting: Obstetrics

## 2013-10-15 ENCOUNTER — Ambulatory Visit: Payer: 59 | Admitting: Obstetrics

## 2013-10-16 ENCOUNTER — Emergency Department (HOSPITAL_COMMUNITY): Payer: Medicaid Other

## 2013-10-16 ENCOUNTER — Encounter (HOSPITAL_COMMUNITY): Payer: Self-pay | Admitting: Emergency Medicine

## 2013-10-16 ENCOUNTER — Emergency Department (HOSPITAL_COMMUNITY)
Admission: EM | Admit: 2013-10-16 | Discharge: 2013-10-16 | Disposition: A | Discharge status: Home / Self Care | Payer: Medicaid Other | Attending: Emergency Medicine | Admitting: Emergency Medicine

## 2013-10-16 DIAGNOSIS — R110 Nausea: Secondary | ICD-10-CM | POA: Insufficient documentation

## 2013-10-16 DIAGNOSIS — R51 Headache: Secondary | ICD-10-CM | POA: Insufficient documentation

## 2013-10-16 DIAGNOSIS — Z880 Allergy status to penicillin: Secondary | ICD-10-CM | POA: Insufficient documentation

## 2013-10-16 DIAGNOSIS — D649 Anemia, unspecified: Secondary | ICD-10-CM | POA: Insufficient documentation

## 2013-10-16 DIAGNOSIS — R42 Dizziness and giddiness: Secondary | ICD-10-CM | POA: Insufficient documentation

## 2013-10-16 DIAGNOSIS — H53149 Visual discomfort, unspecified: Secondary | ICD-10-CM | POA: Insufficient documentation

## 2013-10-16 DIAGNOSIS — R519 Headache, unspecified: Secondary | ICD-10-CM

## 2013-10-16 DIAGNOSIS — R5383 Other fatigue: Secondary | ICD-10-CM | POA: Insufficient documentation

## 2013-10-16 DIAGNOSIS — Z8679 Personal history of other diseases of the circulatory system: Secondary | ICD-10-CM | POA: Insufficient documentation

## 2013-10-16 DIAGNOSIS — E669 Obesity, unspecified: Secondary | ICD-10-CM | POA: Insufficient documentation

## 2013-10-16 DIAGNOSIS — R5381 Other malaise: Secondary | ICD-10-CM | POA: Insufficient documentation

## 2013-10-16 LAB — CBC WITH DIFFERENTIAL/PLATELET
BASOS ABS: 0 10*3/uL (ref 0.0–0.1)
Basophils Relative: 0 % (ref 0–1)
EOS PCT: 6 % — AB (ref 0–5)
Eosinophils Absolute: 0.4 10*3/uL (ref 0.0–0.7)
HEMATOCRIT: 34.4 % — AB (ref 36.0–46.0)
HEMOGLOBIN: 10.9 g/dL — AB (ref 12.0–15.0)
Lymphocytes Relative: 21 % (ref 12–46)
Lymphs Abs: 1.5 10*3/uL (ref 0.7–4.0)
MCH: 23.6 pg — ABNORMAL LOW (ref 26.0–34.0)
MCHC: 31.7 g/dL (ref 30.0–36.0)
MCV: 74.6 fL — AB (ref 78.0–100.0)
MONO ABS: 0.6 10*3/uL (ref 0.1–1.0)
MONOS PCT: 9 % (ref 3–12)
NEUTROS ABS: 4.4 10*3/uL (ref 1.7–7.7)
Neutrophils Relative %: 64 % (ref 43–77)
Platelets: 332 10*3/uL (ref 150–400)
RBC: 4.61 MIL/uL (ref 3.87–5.11)
RDW: 16.2 % — AB (ref 11.5–15.5)
WBC: 6.9 10*3/uL (ref 4.0–10.5)

## 2013-10-16 LAB — BASIC METABOLIC PANEL
BUN: 9 mg/dL (ref 6–23)
CALCIUM: 9.5 mg/dL (ref 8.4–10.5)
CHLORIDE: 101 meq/L (ref 96–112)
CO2: 25 mEq/L (ref 19–32)
CREATININE: 0.5 mg/dL (ref 0.50–1.10)
GFR calc Af Amer: >90 mL/min (ref >90)
GFR calc non Af Amer: >90 mL/min (ref >90)
Glucose, Bld: 94 mg/dL (ref 70–99)
Potassium: 4.3 mEq/L (ref 3.7–5.3)
Sodium: 139 mEq/L (ref 137–147)

## 2013-10-16 MED ORDER — KETOROLAC TROMETHAMINE 30 MG/ML IJ SOLN
30.0000 mg | Freq: Once | INTRAMUSCULAR | Status: AC
  Administered 2013-10-16: 30 mg via INTRAVENOUS
  Filled 2013-10-16: qty 1

## 2013-10-16 MED ORDER — METOCLOPRAMIDE HCL 5 MG/ML IJ SOLN
10.0000 mg | Freq: Once | INTRAMUSCULAR | Status: AC
  Administered 2013-10-16: 10 mg via INTRAVENOUS
  Filled 2013-10-16: qty 2

## 2013-10-16 MED ORDER — DIPHENHYDRAMINE HCL 50 MG/ML IJ SOLN
25.0000 mg | Freq: Once | INTRAMUSCULAR | Status: AC
  Administered 2013-10-16: 25 mg via INTRAVENOUS
  Filled 2013-10-16: qty 1

## 2013-10-16 MED ORDER — DEXAMETHASONE SODIUM PHOSPHATE 10 MG/ML IJ SOLN
10.0000 mg | Freq: Once | INTRAMUSCULAR | Status: AC
  Administered 2013-10-16: 10 mg via INTRAVENOUS
  Filled 2013-10-16: qty 1

## 2013-10-16 MED ORDER — MORPHINE SULFATE 4 MG/ML IJ SOLN
4.0000 mg | Freq: Once | INTRAMUSCULAR | Status: AC
  Administered 2013-10-16: 4 mg via INTRAVENOUS
  Filled 2013-10-16: qty 1

## 2013-10-16 MED ORDER — ONDANSETRON HCL 4 MG/2ML IJ SOLN
4.0000 mg | Freq: Once | INTRAMUSCULAR | Status: AC
  Administered 2013-10-16: 4 mg via INTRAVENOUS
  Filled 2013-10-16: qty 2

## 2013-10-16 NOTE — ED Notes (Signed)
Woke up fine and got dizzy w/ h/a about 815 am was driving had to pull over and states h/a got worse and dizziness cont  Worse when she stands up bp and pulse did not change w/ orthostatics that ptar did pulse remained 100 and bp did not drop  Pt nauseoaus c/o all over weakness. Pt states that she had to hacve c sect i ndec and had to have wound vac that just got d/c about 3 weeks ago states has some burning at incision line but ptar reports no redness at site

## 2013-10-16 NOTE — ED Provider Notes (Signed)
CSN: [id number]     Arrival date & time 10/16/13  1224 History   First MD Initiated Contact with Patient 10/16/13 1227     Chief Complaint  Patient presents with  . Headache  . Dizziness     (Consider location/radiation/quality/duration/timing/severity/associated sxs/prior Treatment) HPI Comments: Patient is a 33 year old female with a past medical history of migraines and obesity who presents with a headache since this morning at 8:15am. Patient reports a sudden  onset and progressive worsening of the headache. The pain is sharp, constant and is located in generalized head without radiation. Patient has tried nothing for symptoms without relief. No alleviating/aggravating factors. Patient reports associated nausea, lightheadedness, generalized weakness and photophobia. Patient denies fever, vomiting, diarrhea, numbness/tingling, visual changes, congestion, chest pain, SOB, abdominal pain.     Patient is a 33 y.o. female presenting with headaches and dizziness.  Headache Associated symptoms: nausea   Associated symptoms: no abdominal pain, no diarrhea, no dizziness, no fatigue, no fever, no neck pain and no vomiting   Dizziness Associated symptoms: headaches and nausea   Associated symptoms: no chest pain, no diarrhea, no palpitations, no shortness of breath and no vomiting     Past Medical History  Diagnosis Date  . Obese   . GERD (gastroesophageal reflux disease)   . Headache(784.0)     history migraine  . Anemia    Past Surgical History  Procedure Laterality Date  . Cesarean section    . Cesarean section with bilateral tubal ligation Bilateral 07/12/2013    Procedure: REPEAT CESAREAN SECTION WITH BILATERAL TUBAL LIGATION;  Surgeon: Shelly Bombard, MD;  Location: [facility];  Service: Obstetrics;  Laterality: Bilateral;   Family History  Problem Relation Age of Onset  . Colitis Mother   . Seizures Mother   . Congestive Heart Failure Mother   . Hyperlipidemia Mother   .  Breast cancer Maternal Aunt   . Heart disease Maternal Grandfather   . Asthma Maternal Grandmother   . Heart attack Maternal Grandmother   . Cancer    . Kidney disease    . Brain cancer    . Prostate cancer    . Hypertension Father    History  Substance Use Topics  . Smoking status: Never Smoker   . Smokeless tobacco: Never Used  . Alcohol Use: No   OB History   Grav Para Term Preterm Abortions TAB SAB Ect Mult Living   '2 2 2 ' 0 0 0 0 0 0 2     Review of Systems  Constitutional: Negative for fever, chills and fatigue.  HENT: Negative for trouble swallowing.   Eyes: Negative for visual disturbance.  Respiratory: Negative for shortness of breath.   Cardiovascular: Negative for chest pain and palpitations.  Gastrointestinal: Positive for nausea. Negative for vomiting, abdominal pain and diarrhea.  Genitourinary: Negative for dysuria and difficulty urinating.  Musculoskeletal: Negative for arthralgias and neck pain.  Skin: Negative for color change.  Neurological: Positive for weakness, light-headedness and headaches. Negative for dizziness.  Psychiatric/Behavioral: Negative for dysphoric mood.      Allergies  Fish allergy; Shellfish allergy; and Penicillins  Home Medications   Current Outpatient Rx  Name  Route  Sig  Dispense  Refill  . ibuprofen (ADVIL,MOTRIN) 600 MG tablet   Oral   Take 1 tablet (600 mg total) by mouth every 6 (six) hours as needed for moderate pain.   30 tablet   5   . lidocaine-hydrocortisone (ANAMANTLE) 3-1 % KIT  Rectal   Place 1 application rectally 2 (two) times daily.         . Prenat w/o [id number] (CITRANATAL DHA PO)   Oral   Take by mouth.          BP 133/77  Pulse 82  Temp(Src) 98 F (36.7 C) (Oral)  Resp 14  SpO2 100%  LMP 09/17/2013 Physical Exam  Nursing note and vitals reviewed. Constitutional: She is oriented to person, place, and time. She appears well-developed and well-nourished. No distress.  HENT:   Head: Normocephalic and atraumatic.  Mouth/Throat: Oropharynx is clear and moist. No oropharyngeal exudate.  Eyes: Conjunctivae and EOM are normal. Pupils are equal, round, and reactive to light.  Neck: Normal range of motion.  Cardiovascular: Normal rate and regular rhythm.  Exam reveals no gallop and no friction rub.   No murmur heard. Pulmonary/Chest: Effort normal and breath sounds normal. She has no wheezes. She has no rales. She exhibits no tenderness.  Abdominal: Soft. She exhibits no distension. There is no tenderness. There is no rebound and no guarding.  Musculoskeletal: Normal range of motion.  Neurological: She is alert and oriented to person, place, and time. No cranial nerve deficit. Coordination normal.  Extremity strength and sensation equal and intact bilaterally. Speech is goal-oriented. Moves limbs without ataxia.   Skin: Skin is warm and dry.  Psychiatric: She has a normal mood and affect. Her behavior is normal.    ED Course  Procedures (including critical care time) Labs Review Labs Reviewed  CBC WITH DIFFERENTIAL - Abnormal; Notable for the following:    Hemoglobin 10.9 (*)    HCT 34.4 (*)    MCV 74.6 (*)    MCH 23.6 (*)    RDW 16.2 (*)    Eosinophils Relative 6 (*)    All other components within normal limits  BASIC METABOLIC PANEL   Imaging Review Ct Head Wo Contrast  10/16/2013   CLINICAL DATA:  Headache  EXAM: CT HEAD WITHOUT CONTRAST  TECHNIQUE: Contiguous axial images were obtained from the base of the skull through the vertex without intravenous contrast.  COMPARISON:  CT HEAD W/O CM dated 06/12/2006  FINDINGS: No acute hemorrhage, infarct, or mass lesion is identified. No midline shift. Ventricles are normal in size. Orbits and paranasal sinuses are unremarkable. No skull fracture. Mild pansinusitis noted.  IMPRESSION: No acute intracranial findings.  Mild pansinusitis.   Electronically Signed   By: Conchita Paris M.D.   On: 10/16/2013 13:56     EKG  Interpretation None      MDM   Final diagnoses:  Headache    2:18 PM Patient's CT head unremarkable for acute changes. Labs unremarkable for acute changes. Vitals stable and patient afebrile. Patient given morphine and zofran for symptoms. Patient will have toradol, reglan, benadryl, and decadron for symptoms.   3:19 PM Patient feeling better and will be discharged with instructions to follow up with her PCP. Vitals stable and patient afebrile. Patient is not orthostatic.   Alvina Chou, PA-C 10/16/13 1521

## 2013-10-16 NOTE — ED Provider Notes (Signed)
Medical screening examination/treatment/procedure(s) were performed by non-physician practitioner and as supervising physician I was immediately available for consultation/collaboration.   EKG Interpretation None       Sally Reimers L Sic[place] Zaragoza, MD 10/16/13 1741 

## 2013-10-16 NOTE — Discharge Instructions (Signed)
Follow up with your doctor for further evaluation. Refer to attached documents for more information. Return to the ED with worsening or concerning symptoms.  °

## 2014-03-21 ENCOUNTER — Telehealth: Payer: Self-pay | Admitting: *Deleted

## 2014-03-21 NOTE — Telephone Encounter (Signed)
[facility] sent a refill request for Omeprazole DR 20 MG Capsule Qty 60. Dr. Clearance Coots autorized with 5 additional refills. Request faxed back to pharmacy on 03/21/14

## 2014-05-26 ENCOUNTER — Encounter (HOSPITAL_COMMUNITY): Payer: Self-pay | Admitting: Emergency Medicine

## 2015-10-07 ENCOUNTER — Ambulatory Visit: Payer: Medicaid Other | Admitting: Obstetrics

## 2015-10-08 ENCOUNTER — Telehealth: Payer: Self-pay

## 2015-10-08 ENCOUNTER — Ambulatory Visit (INDEPENDENT_AMBULATORY_CARE_PROVIDER_SITE_OTHER): Payer: Medicaid Other | Admitting: Certified Nurse Midwife

## 2015-10-08 ENCOUNTER — Encounter: Payer: Self-pay | Admitting: Certified Nurse Midwife

## 2015-10-08 VITALS — BP 116/72 | HR 113 | Temp 98.1°F | Wt 285.0 lb

## 2015-10-08 DIAGNOSIS — Z01419 Encounter for gynecological examination (general) (routine) without abnormal findings: Secondary | ICD-10-CM

## 2015-10-08 DIAGNOSIS — B3731 Acute candidiasis of vulva and vagina: Secondary | ICD-10-CM

## 2015-10-08 DIAGNOSIS — Z0000 Encounter for general adult medical examination without abnormal findings: Secondary | ICD-10-CM | POA: Diagnosis not present

## 2015-10-08 DIAGNOSIS — N939 Abnormal uterine and vaginal bleeding, unspecified: Secondary | ICD-10-CM

## 2015-10-08 DIAGNOSIS — B373 Candidiasis of vulva and vagina: Secondary | ICD-10-CM

## 2015-10-08 DIAGNOSIS — E669 Obesity, unspecified: Secondary | ICD-10-CM

## 2015-10-08 DIAGNOSIS — Z113 Encounter for screening for infections with a predominantly sexual mode of transmission: Secondary | ICD-10-CM

## 2015-10-08 MED ORDER — TERCONAZOLE 0.4 % VA CREA: 1.0000 | TOPICAL_CREAM | Freq: Every day | VAGINAL | Status: DC

## 2015-10-08 MED ORDER — VITAFOL-NANO 18-0.6-0.4 MG PO TABS: 1.0000 | ORAL_TABLET | Freq: Every day | ORAL | Status: DC

## 2015-10-08 MED ORDER — PHENTERMINE HCL 37.5 MG PO CAPS: 37.5000 mg | ORAL_CAPSULE | ORAL | Status: DC

## 2015-10-08 MED ORDER — FLUCONAZOLE 100 MG PO TABS: 100.0000 mg | ORAL_TABLET | Freq: Once | ORAL | Status: DC

## 2015-10-08 NOTE — Telephone Encounter (Signed)
NEED THE ORDER FIXED FOR TRANSVAGINAL NON-OB - THANKS!

## 2015-10-08 NOTE — Progress Notes (Signed)
Patient ID: Brooke Chan, female   DOB: 1981-06-13, 35 y.o.   MRN: [id number]   Subjective:      Brooke Chan is a 35 y.o. female here for a routine exam.  Current complaints: none.  Periods are regular, 7-12 days, clots about quarter sized.    Currently sexually active.  Desires full STD screening exam.    Personal health questionnaire:  Is patient Ashkenazi Jewish, have a family history of breast and/or ovarian cancer: yes Is there a family history of uterine cancer diagnosed at age < 67, gastrointestinal cancer, urinary tract cancer, family member who is a Personnel officer syndrome-associated carrier: no Is the patient overweight and hypertensive, family history of diabetes, personal history of gestational diabetes, preeclampsia or PCOS: yes Is patient over 72, have PCOS,  family history of premature CHD under age 68, diabetes, smoke, have hypertension or peripheral artery disease:  yes At any time, has a partner hit, kicked or otherwise hurt or frightened you?: no Over the past 2 weeks, have you felt down, depressed or hopeless?: no Over the past 2 weeks, have you felt little interest or pleasure in doing things?:no   Gynecologic History Patient's last menstrual period was 09/24/2015. Contraception: tubal ligation Last Pap: N/A. Results were: normal according to the patient Last mammogram: N/A.   Obstetric History OB History  Gravida Para Term Preterm AB SAB TAB Ectopic Multiple Living  0 0 0 0 0 0 2    # Outcome Date GA Lbr Len/2nd Weight Sex Delivery Anes PTL Lv  2 Term 07/12/13 [redacted]w[redacted]d   F CS-LTranv Spinal  Y  1 Term 11/22/00    M CS-LTranv Spinal  Y      Past Medical History  Diagnosis Date  . Obese   . GERD (gastroesophageal reflux disease)   . Headache(784.0)     history migraine  . Anemia     Past Surgical History  Procedure Laterality Date  . Cesarean section    . Cesarean section with bilateral tubal ligation Bilateral 07/12/2013    Procedure: REPEAT  CESAREAN SECTION WITH BILATERAL TUBAL LIGATION;  Surgeon: Brock Bad, MD;  Location: [facility];  Service: Obstetrics;  Laterality: Bilateral;     Current outpatient prescriptions:  .  fluconazole (DIFLUCAN) 100 MG tablet, Take 1 tablet (100 mg total) by mouth once. Repeat dose in 48-72 hour., Disp: 3 tablet, Rfl: 0 .  phentermine 37.5 MG capsule, Take 1 capsule (37.5 mg total) by mouth every morning., Disp: 30 capsule, Rfl: 3 .  Prenatal-Fe Fum-Methf-FA w/o A (VITAFOL-NANO) 18-0.6-0.4 MG TABS, Take 1 tablet by mouth daily., Disp: 30 tablet, Rfl: 12 .  terconazole (TERAZOL 7) 0.4 % vaginal cream, Place 1 applicator vaginally at bedtime., Disp: 45 g, Rfl: 0 Allergies  Allergen Reactions  . Fish Allergy Hives  . Shellfish Allergy Hives  . Penicillins Hives    Social History  Substance Use Topics  . Smoking status: Never Smoker   . Smokeless tobacco: Never Used  . Alcohol Use: No    Family History  Problem Relation Age of Onset  . Colitis Mother   . Seizures Mother   . Congestive Heart Failure Mother   . Hyperlipidemia Mother   . Breast cancer Maternal Aunt   . Heart disease Maternal Grandfather   . Asthma Maternal Grandmother   . Heart attack Maternal Grandmother   . Cancer    . Kidney disease    . Brain cancer    .  Prostate cancer    . Hypertension Father       Review of Systems  Constitutional: negative for fatigue and weight loss Respiratory: negative for cough and wheezing Cardiovascular: negative for chest pain, fatigue and palpitations Gastrointestinal: negative for abdominal pain and change in bowel habits Musculoskeletal:negative for myalgias Neurological: negative for gait problems and tremors Behavioral/Psych: negative for abusive relationship, depression Endocrine: negative for temperature intolerance   Genitourinary:negative for abnormal menstrual periods, genital lesions, hot flashes, sexual problems and vaginal discharge Integument/breast: negative for  breast lump, breast tenderness, nipple discharge and skin lesion(s)    Objective:       BP 116/72 mmHg  Pulse 113  Temp(Src) 98.1 F (36.7 C)  Wt 285 lb (129.275 kg)  LMP 09/24/2015 General:   alert  Skin:   no rash or abnormalities  Lungs:   clear to auscultation bilaterally  Heart:   regular rate and rhythm, S1, S2 normal, no murmur, click, rub or gallop  Breasts:   normal without suspicious masses, skin or nipple changes or axillary nodes  Abdomen:  normal findings: no organomegaly, soft, non-tender and no hernia obese  Pelvis:  External genitalia: normal general appearance Urinary system: urethral meatus normal and bladder without fullness, nontender Vaginal: normal without tenderness, induration or masses Cervix: normal appearance Adnexa: normal bimanual exam Uterus: anteverted and non-tender, normal size   Lab Review Urine pregnancy test Labs reviewed yes Radiologic studies reviewed no  50% of 30 min visit spent on counseling and coordination of care.   Assessment:    Healthy female exam.   AUB  Obesity  Plan:    Education reviewed: calcium supplements, depression evaluation, low fat, low cholesterol diet, safe sex/STD prevention, self breast exams, skin cancer screening and weight bearing exercise. Contraception: tubal ligation. Follow up in: 1 month.   Meds ordered this encounter  Medications  . phentermine 37.5 MG capsule    Sig: Take 1 capsule (37.5 mg total) by mouth every morning.    Dispense:  30 capsule    Refill:  3  . Prenatal-Fe Fum-Methf-FA w/o A (VITAFOL-NANO) 18-0.6-0.4 MG TABS    Sig: Take 1 tablet by mouth daily.    Dispense:  30 tablet    Refill:  12  . fluconazole (DIFLUCAN) 100 MG tablet    Sig: Take 1 tablet (100 mg total) by mouth once. Repeat dose in 48-72 hour.    Dispense:  3 tablet    Refill:  0  . terconazole (TERAZOL 7) 0.4 % vaginal cream    Sig: Place 1 applicator vaginally at bedtime.    Dispense:  45 g    Refill:  0    Orders Placed This Encounter  Procedures  . US Pelvis Complete    Standing Status: Future     Number of Occurrences:      Standing Expiration Date: 12/07/2016    Order Specific Question:  Reason for Exam (SYMPTOM  OR DIAGNOSIS REQUIRED)    Answer:  AUB    Order Specific Question:  Preferred imaging location?    Answer:  [facility]  . US Pelvis Complete    Standing Status: Future     Number of Occurrences:      Standing Expiration Date: 12/07/2016    Order Specific Question:  Reason for Exam (SYMPTOM  OR DIAGNOSIS REQUIRED)    Answer:  AUB    Order Specific Question:  Preferred imaging location?    Answer:  [facility]  . HIV antibody (with reflex)  .  Hepatitis B surface antigen  . RPR  . Hepatitis C antibody  . TSH  . CBC with Differential/Platelet  . Comprehensive metabolic panel  . HgB A1c  . NuSwab Vaginitis Plus (VG+)  . Referral to Nutrition and Diabetes Services    Referral Priority:  Routine    Referral Type:  Consultation    Referral Reason:  Specialty Services Required    Number of Visits Requested:  1   Need to obtain previous records Possible management options include: Mirena IUD for AUB Follow up in 1 month for IUD and US results.

## 2015-10-09 ENCOUNTER — Encounter: Payer: Self-pay | Admitting: Certified Nurse Midwife

## 2015-10-09 ENCOUNTER — Other Ambulatory Visit: Payer: Self-pay | Admitting: Certified Nurse Midwife

## 2015-10-09 DIAGNOSIS — N939 Abnormal uterine and vaginal bleeding, unspecified: Secondary | ICD-10-CM

## 2015-10-09 LAB — HIV ANTIBODY (ROUTINE TESTING W REFLEX): HIV Screen 4th Generation wRfx: NONREACTIVE

## 2015-10-09 LAB — COMPREHENSIVE METABOLIC PANEL
ALBUMIN: 4 g/dL (ref 3.5–5.5)
ALK PHOS: 73 IU/L (ref 39–117)
ALT: 11 IU/L (ref 0–32)
AST: 12 IU/L (ref 0–40)
Albumin/Globulin Ratio: 1.3 (ref 1.2–2.2)
BILIRUBIN TOTAL: 0.3 mg/dL (ref 0.0–1.2)
BUN / CREAT RATIO: 17 (ref 8–20)
BUN: 13 mg/dL (ref 6–20)
CHLORIDE: 102 mmol/L (ref 96–106)
CO2: 26 mmol/L (ref 18–29)
Calcium: 8.9 mg/dL (ref 8.7–10.2)
Creatinine, Ser: 0.77 mg/dL (ref 0.57–1.00)
GFR calc Af Amer: 116 mL/min/{1.73_m2} (ref >59)
GFR calc non Af Amer: 100 mL/min/{1.73_m2} (ref >59)
GLUCOSE: 91 mg/dL (ref 65–99)
Globulin, Total: 3.2 g/dL (ref 1.5–4.5)
Potassium: 4.2 mmol/L (ref 3.5–5.2)
Sodium: 141 mmol/L (ref 134–144)
Total Protein: 7.2 g/dL (ref 6.0–8.5)

## 2015-10-09 LAB — TSH: TSH: 1.93 u[IU]/mL (ref 0.450–4.500)

## 2015-10-09 LAB — CBC WITH DIFFERENTIAL/PLATELET
Basophils Absolute: 0 10*3/uL (ref 0.0–0.2)
Basos: 1 %
EOS (ABSOLUTE): 0.3 10*3/uL (ref 0.0–0.4)
Eos: 5 %
Hematocrit: 35.1 % (ref 34.0–46.6)
Hemoglobin: 11.3 g/dL (ref 11.1–15.9)
Immature Grans (Abs): 0 10*3/uL (ref 0.0–0.1)
Immature Granulocytes: 0 %
LYMPHS ABS: 2.1 10*3/uL (ref 0.7–3.1)
Lymphs: 32 %
MCH: 23.9 pg — AB (ref 26.6–33.0)
MCHC: 32.2 g/dL (ref 31.5–35.7)
MCV: 74 fL — ABNORMAL LOW (ref 79–97)
MONOCYTES: 9 %
MONOS ABS: 0.6 10*3/uL (ref 0.1–0.9)
Neutrophils Absolute: 3.6 10*3/uL (ref 1.4–7.0)
Neutrophils: 53 %
PLATELETS: 390 10*3/uL — AB (ref 150–379)
RBC: 4.72 x10E6/uL (ref 3.77–5.28)
RDW: 16.3 % — ABNORMAL HIGH (ref 12.3–15.4)
WBC: 6.6 10*3/uL (ref 3.4–10.8)

## 2015-10-09 LAB — HEPATITIS B SURFACE ANTIGEN: HEP B S AG: NEGATIVE

## 2015-10-09 LAB — HEPATITIS C ANTIBODY: Hep C Virus Ab: <0.1 s/co ratio (ref 0.0–0.9)

## 2015-10-09 LAB — HEMOGLOBIN A1C
Est. average glucose Bld gHb Est-mCnc: 131 mg/dL
HEMOGLOBIN A1C: 6.2 % — AB (ref 4.8–5.6)

## 2015-10-09 LAB — RPR: RPR Ser Ql: NONREACTIVE

## 2015-10-10 LAB — NUSWAB VAGINITIS PLUS (VG+)
ATOPOBIUM VAGINAE: HIGH {score} — AB
Candida albicans, NAA: POSITIVE — AB
Candida glabrata, NAA: NEGATIVE
Chlamydia trachomatis, NAA: NEGATIVE
Neisseria gonorrhoeae, NAA: NEGATIVE
Trich vag by NAA: NEGATIVE

## 2015-10-13 ENCOUNTER — Other Ambulatory Visit: Payer: Self-pay | Admitting: Certified Nurse Midwife

## 2015-10-13 LAB — PAP IG AND HPV HIGH-RISK: PAP Smear Comment: 0

## 2015-10-13 LAB — HPV, LOW VOLUME (REFLEX): HPV, LOW VOL REFLEX: NEGATIVE

## 2015-10-14 ENCOUNTER — Telehealth: Payer: Self-pay

## 2015-10-14 NOTE — Telephone Encounter (Signed)
[facility] NEEDS NON-OB ORDER - THANKS!

## 2015-10-15 ENCOUNTER — Other Ambulatory Visit: Payer: Self-pay | Admitting: Certified Nurse Midwife

## 2015-10-16 ENCOUNTER — Ambulatory Visit (HOSPITAL_COMMUNITY)
Admission: RE | Admit: 2015-10-16 | Discharge: 2015-10-16 | Disposition: A | Discharge status: Home / Self Care | Payer: Medicaid Other | Source: Ambulatory Visit | Attending: Certified Nurse Midwife | Admitting: Certified Nurse Midwife

## 2015-10-16 DIAGNOSIS — N939 Abnormal uterine and vaginal bleeding, unspecified: Secondary | ICD-10-CM | POA: Insufficient documentation

## 2015-10-16 DIAGNOSIS — N83202 Unspecified ovarian cyst, left side: Secondary | ICD-10-CM | POA: Diagnosis not present

## 2015-10-20 ENCOUNTER — Other Ambulatory Visit: Payer: Self-pay | Admitting: Certified Nurse Midwife

## 2015-10-22 ENCOUNTER — Other Ambulatory Visit: Payer: Self-pay | Admitting: Certified Nurse Midwife

## 2015-10-22 ENCOUNTER — Telehealth: Payer: Self-pay | Admitting: *Deleted

## 2015-10-22 NOTE — Telephone Encounter (Signed)
Patient called asking about her ultrasound results. After results reviewed by Orvilla Cornwallachelle Denney, CNM patient advised ultrasound results are within normal limits.

## 2015-10-23 ENCOUNTER — Emergency Department (HOSPITAL_COMMUNITY)
Admission: EM | Admit: 2015-10-23 | Discharge: 2015-10-24 | Disposition: A | Discharge status: Home / Self Care | Payer: Medicaid Other | Attending: Emergency Medicine | Admitting: Emergency Medicine

## 2015-10-23 ENCOUNTER — Telehealth: Payer: Self-pay | Admitting: *Deleted

## 2015-10-23 ENCOUNTER — Encounter (HOSPITAL_COMMUNITY): Payer: Self-pay

## 2015-10-23 DIAGNOSIS — H1013 Acute atopic conjunctivitis, bilateral: Secondary | ICD-10-CM | POA: Diagnosis not present

## 2015-10-23 DIAGNOSIS — Z862 Personal history of diseases of the blood and blood-forming organs and certain disorders involving the immune mechanism: Secondary | ICD-10-CM | POA: Insufficient documentation

## 2015-10-23 DIAGNOSIS — Z880 Allergy status to penicillin: Secondary | ICD-10-CM | POA: Diagnosis not present

## 2015-10-23 DIAGNOSIS — Z8719 Personal history of other diseases of the digestive system: Secondary | ICD-10-CM | POA: Insufficient documentation

## 2015-10-23 DIAGNOSIS — Z79899 Other long term (current) drug therapy: Secondary | ICD-10-CM | POA: Diagnosis not present

## 2015-10-23 DIAGNOSIS — H02846 Edema of left eye, unspecified eyelid: Secondary | ICD-10-CM | POA: Diagnosis present

## 2015-10-23 DIAGNOSIS — E669 Obesity, unspecified: Secondary | ICD-10-CM | POA: Diagnosis not present

## 2015-10-23 NOTE — ED Notes (Signed)
Pt started scratching her eyes tonight about 10 pm, both eyes are swollen left more than the right

## 2015-10-23 NOTE — Telephone Encounter (Signed)
error 

## 2015-10-24 MED ORDER — OLOPATADINE HCL 0.1 % OP SOLN: 1.0000 [drp] | Freq: Two times a day (BID) | OPHTHALMIC | Status: DC

## 2015-10-24 MED ORDER — CETIRIZINE HCL 10 MG PO TABS: 10.0000 mg | ORAL_TABLET | Freq: Every day | ORAL | Status: DC

## 2015-10-24 NOTE — Discharge Instructions (Signed)
Return to the ED with any concerns including changes in vision, fever/chills, redness around eyes, or any other alarming symptoms  You should use warm compresses three times daily

## 2015-10-24 NOTE — ED Provider Notes (Signed)
CSN: [id number]     Arrival date & time 10/23/15  2313 History    By signing my name below, I, Arlan Organ, attest that this documentation has been prepared under the direction and in the presence of Jerelyn Scott, MD.  Electronically Signed: Arlan Organ, ED Scribe. 10/24/2015. 1:30 AM.   Chief Complaint  Patient presents with  . Eye Problem   The history is provided by the patient. No language interpreter was used.    HPI Comments: Brooke Chan is a 35 y.o. female with a PMHx of anemia who presents to the Emergency Department ongoing, improving bilateral eye swelling; L greater than R onset 10:00 PM this evening. Pt also reports mild redness to eyes. Pt states she was resting at home when she felt something in her eye. She states she began to rub both eyes in attempt to remove possible foreign body without any success. No aggravating or alleviating factors at this time. OTC Zyrtec attempted prior to arrival with some improvement. No recent fever or chills. No new medications, lotions, or cosmetic products. Pt with known allergy to Penicillins.  PCP: Cala Bradford, MD    Past Medical History  Diagnosis Date  . Obese   . GERD (gastroesophageal reflux disease)   . Headache(784.0)     history migraine  . Anemia    Past Surgical History  Procedure Laterality Date  . Cesarean section    . Cesarean section with bilateral tubal ligation Bilateral 07/12/2013    Procedure: REPEAT CESAREAN SECTION WITH BILATERAL TUBAL LIGATION;  Surgeon: Brock Bad, MD;  Location: [facility];  Service: Obstetrics;  Laterality: Bilateral;   Family History  Problem Relation Age of Onset  . Colitis Mother   . Seizures Mother   . Congestive Heart Failure Mother   . Hyperlipidemia Mother   . Breast cancer Maternal Aunt   . Heart disease Maternal Grandfather   . Asthma Maternal Grandmother   . Heart attack Maternal Grandmother   . Cancer    . Kidney disease    . Brain cancer    . Prostate cancer     . Hypertension Father    Social History  Substance Use Topics  . Smoking status: Never Smoker   . Smokeless tobacco: Never Used  . Alcohol Use: No   OB History    Gravida Para Term Preterm AB TAB SAB Ectopic Multiple Living   0 0 0 0 0 0 2     Review of Systems  Constitutional: Negative for fever and chills.  Eyes: Positive for redness.  All other systems reviewed and are negative.     Allergies  Fish allergy; Shellfish allergy; and Penicillins  Home Medications   Prior to Admission medications   Medication Sig Start Date End Date Taking? Authorizing Provider  cetirizine (ZYRTEC) 10 MG tablet Take 1 tablet (10 mg total) by mouth daily. 10/24/15   Jerelyn Scott, MD  fluconazole (DIFLUCAN) 100 MG tablet Take 1 tablet (100 mg total) by mouth once. Repeat dose in 48-72 hour. 10/08/15   Rachelle A Denney, CNM  olopatadine (PATANOL) 0.1 % ophthalmic solution Place 1 drop into both eyes 2 (two) times daily. 10/24/15   Jerelyn Scott, MD  phentermine 37.5 MG capsule Take 1 capsule (37.5 mg total) by mouth every morning. 10/08/15   Rachelle A Denney, CNM  Prenatal-Fe Fum-Methf-FA w/o A (VITAFOL-NANO) 18-0.6-0.4 MG TABS Take 1 tablet by mouth daily. 10/08/15   Roe Coombs, CNM  terconazole (  TERAZOL 7) 0.4 % vaginal cream Place 1 applicator vaginally at bedtime. 10/08/15   Roe Coombsachelle A Denney, CNM   Triage Vitals: BP 130/84 mmHg  Pulse 88  Temp(Src) 98 F (36.7 C) (Oral)  Resp 20  SpO2 100%  LMP 09/24/2015  Vitals reviewed Physical Exam  Physical Examination: General appearance - alert, well appearing, and in no distress Mental status - alert, oriented to person, place, and time Eyes - bilateral conjunctival injection, left greater than right, mild eyelid swelling of left upper lid, clear drainage, no foreign body identified, EOMI without pain Mouth - mucous membranes moist, pharynx normal without lesions Neck - supple, no significant adenopathy Chest - clear to  auscultation, no wheezes, rales or rhonchi, symmetric air entry Neurological - alert, oriented, normal speech Extremities - peripheral pulses normal, no pedal edema, no clubbing or cyanosis Skin - normal coloration and turgor, no rashes  ED Course  Procedures (including critical care time)  DIAGNOSTIC STUDIES: Oxygen Saturation is 100% on RA, Normal by my interpretation.    COORDINATION OF CARE: 12:48 AM-Discussed treatment plan with pt at bedside and pt agreed to plan.     Labs Review Labs Reviewed - No data to display  Imaging Review No results found. I have personally reviewed and evaluated these images and lab results as part of my medical decision-making.   EKG Interpretation None      MDM   Final diagnoses:  Allergic conjunctivitis, bilateral    Pt presenting with co eyes itching and eyelid swelling.  Pt has hx of allergies and symptoms are improved after taking zyrtec at home- bilateral nature of symptoms as well as the above point most to allergic conjunctivitis.  Doubt bacterial source.  No pain to suggest corneal abrasion, no signs of orbital or periorbital cellulitis.  Pt started on patanol drops and given rx for zyrtec as she requested this and has run out at home.  Discharged with strict return precautions.  Pt agreeable with plan. I personally performed the services described in this documentation, which was scribed in my presence. The recorded information has been reviewed and is accurate.    Jerelyn ScottMartha Linker, MD 10/24/15 [phone]

## 2015-11-19 ENCOUNTER — Encounter: Payer: Self-pay | Admitting: *Deleted

## 2015-11-19 ENCOUNTER — Encounter: Payer: Medicaid Other | Attending: Certified Nurse Midwife | Admitting: *Deleted

## 2015-11-19 DIAGNOSIS — E669 Obesity, unspecified: Secondary | ICD-10-CM | POA: Diagnosis present

## 2015-11-19 NOTE — Patient Instructions (Signed)
Follow MyPlate recommendations for meal planning Aim for 3 food groups/meal and 2 per snack It's ok to have fruit any time of day. It's ok to drink water when you're thirsty, but not force yourself Bring back grains with your meals Aim to eat while seated without distractions  Goals for your daughter:  3 scheduled meals and 1 scheduled snack between each meal.    Sit at the table as a family  Turn off tv while eating and minimize all other distractions  Do not force or bribe or try to influence the amount of food (s)he eats.  Let him/her decide how much.    Do not fix something else for him/her to eat if (s)he doesn't eat the meal  Serve variety of foods at each meal so (s)he has things to chose from  Set good example by eating a variety of foods yourself  Sit at the table for 30 minutes then (s)he can get down.  If (s)he hasn't eaten that much, put it back in the fridge.  However, she must wait until the next scheduled meal or snack to eat again.  Do not allow grazing throughout the day  Be patient.  It can take awhile for him/her to learn new habits and to adjust to new routines.  But stick to your guns!  You're the boss, not him/her  Keep in mind, it can take up to 20 exposures to a new food before (s)he accepts it  Serve milk with meals, juice diluted with water as needed for constipation, and water any other time  Limit refined sweets, but do not forbid them

## 2015-11-19 NOTE — Progress Notes (Signed)
  Medical Nutrition Therapy:  Appt start time: 1030 end time:  1130.   Assessment:  Primary concerns today: Brooke Chan is here for nutrition counseling pertaining to referral for obesity.  Recent A1C is 6.2%.  States she's been on phentermine.  States she has cut out bread and most starches.  States she eats baked chicken and fruits and is drinking more water.  She is exercising daily by going to elliptical machine, jabs, sit ups, squats.  She is also drinking more water.  Is working with her cousin who is a Water quality scientisttrainer and [name] states that she can feel a physical difference.  She does the grocery shopping and cooking for the household.   States she is not buying as many energy-dense foods.  She is buying organic, taking vinegar daily and is buying salt-free foods.  She doesn't eat out often anymore.  She has cut back on red meat also.  When at home she doesn't eat while seated as she is chasing her daughter.  Sometimes she is eating while distracted and is not a fast eater.  Preferred Learning Style:  No preference indicated   Learning Readiness:   Change in progress   MEDICATIONS: see list   DIETARY INTAKE:  Usual eating pattern includes 3 meals and 3-4 snacks per day.  Avoided foods include fish (allergy), bread and starches, limits fruit to just in the morning.    24-hr recall:  B ( AM): apple  Snk ( AM): baked chicken leg with lettuce and light olive oil vingarette Snk: sunflower seeds or chex mix  L ( PM): baked chicken with spinach Snk ( PM): chex mix, salt-free pretzels, almonds, sunflower seeds D ( PM): ground Malawiturkey or chicken Snk ( PM): frozen grapes, but not much Beverages: water (1 gallon/day)  Usual physical activity: gym twice weekly.  Body weight stuff daily  Estimated energy needs: 2000-2600 calories    Nutritional Diagnosis:  NI-5.8.1 Inadequate carbohydrate intake As related to dieting.  As evidenced by dietary recall.    Intervention:  Nutrition counseling  provided.  Questioned sustainability of Marylou's current dietary pattern and suggested a balance.   Discussed MyPlate recommendations for meal planning, focusing on including carbohydrates for fuel.  Provided feedback on fluid needs and discussed mindful eating  Teaching Method Utilized: Visual Auditory   Handouts given during visit include:  MyPlate  Barriers to learning/adherence to lifestyle change: none  Demonstrated degree of understanding via:  Teach Back   Monitoring/Evaluation:  Dietary intake, exercise, labs, and body weight prn.

## 2016-02-04 ENCOUNTER — Other Ambulatory Visit: Payer: Self-pay | Admitting: Certified Nurse Midwife

## 2016-02-15 ENCOUNTER — Telehealth: Payer: Self-pay | Admitting: *Deleted

## 2016-02-15 NOTE — Telephone Encounter (Signed)
Pharmacy is calling- they need an alternative PNV for patient.

## 2016-02-16 ENCOUNTER — Other Ambulatory Visit: Payer: Self-pay | Admitting: Certified Nurse Midwife

## 2016-02-16 MED ORDER — OB COMPLETE GOLD 27.5-1-200 MG PO CAPS: 1.0000 | ORAL_CAPSULE | Freq: Every day | ORAL | 99 refills | Status: DC

## 2016-02-16 NOTE — Telephone Encounter (Signed)
OB complete gold was sent to the pharmacy.

## 2016-03-02 ENCOUNTER — Telehealth: Payer: Self-pay | Admitting: *Deleted

## 2016-03-02 NOTE — Telephone Encounter (Signed)
Patient is trying to find a PNV without fish products due to allergies.Talked to pharmacist and almost all  PNV's vitamins have some in it.Some over the counter PNV's do not.Asked patient to check an over the counter vitamin and take to pharmacist to make sure there are no fish products in the vitamin.Patient made aware needs an appointment to see Rachelle to check her blood pressure before she can have another prescription for Phentermine.

## 2016-03-04 ENCOUNTER — Ambulatory Visit (INDEPENDENT_AMBULATORY_CARE_PROVIDER_SITE_OTHER): Payer: Medicaid Other | Admitting: Certified Nurse Midwife

## 2016-03-04 VITALS — BP 128/87 | HR 84 | Wt 234.9 lb

## 2016-03-04 DIAGNOSIS — E669 Obesity, unspecified: Secondary | ICD-10-CM

## 2016-03-04 DIAGNOSIS — R7303 Prediabetes: Secondary | ICD-10-CM | POA: Diagnosis not present

## 2016-03-08 ENCOUNTER — Encounter: Payer: Self-pay | Admitting: Certified Nurse Midwife

## 2016-03-08 DIAGNOSIS — R7303 Prediabetes: Secondary | ICD-10-CM | POA: Insufficient documentation

## 2016-03-08 NOTE — Progress Notes (Addendum)
Patient not seen by provider.  Blood pressure WNL.  Has been on phentermine and has  lost a significant amount of weight.  BMI was 45.31 in March, currently 37.35.  Current weight is 234 lbs, loss of roughly 50lbs from March 2017.  Needs consult for obesity.  States that she is using a Psychologist, educationaltrainer and working out.    Phentermine was given in March for 3 months, desires refill today.  Refill not given.   Has had a nutrition consult.  Recent A1C was 6/2%.

## 2016-03-09 ENCOUNTER — Telehealth: Payer: Self-pay

## 2016-03-09 ENCOUNTER — Telehealth: Payer: Self-pay | Admitting: Certified Nurse Midwife

## 2016-03-09 NOTE — Telephone Encounter (Signed)
Called pt to set up appt for a primary care provider from the referral in the system. Pt states she already see a primary care doctor but that doctor do not want to give medicine. Let patient know that if she wanted to switch providers she need to contact the medicaid office to switch doctors on her card and set up a new provider appt with that doctor.

## 2016-03-09 NOTE — Telephone Encounter (Signed)
Pt called and was adamant about speaking with Rachelle concerning the Phentermine medication. Pt stated that she didn't understand the purpose of her last visit and nor did she understand why RD told her that she would continue to prescribe her the medication if that wasn't what she was really going to do. Pt really wanted to speak with RD personally, but will settle for a nurse regarding an explanation. Please advise.

## 2016-03-09 NOTE — Telephone Encounter (Signed)
She has been on the medication for greater than 6 months.  She needs to see a primary to manage her weight loss.  Her case was discussed with Dr. Clearance CootsHarper who was in agreement with the plan.  She should be congratulated for her weight loss thus far and that she has done well.  Medicaid will not pay for any of the other weight loss medications that are very expensive.  She has probably reached the maximum weight loss with phentermine that she can and will need something like Contrave, which is not covered by her insurance.  I do not remember agreeing to keeping her on phentermine for a long period of time due to the side effects, it is much safer for her to be exercising and eating right which she is doing.  I have put in for a primary care consult for her so that they can continue to manage her weight loss.  I am not a bariatric specialist, I got her started and now she needs management by someone who is a specialist in that area.  Thank you.  R.Trilby Way CNM

## 2016-03-10 NOTE — Telephone Encounter (Signed)
See below

## 2016-03-11 NOTE — Telephone Encounter (Signed)
[name] [name] Will you please call this patient.  Thank you.  R.Nikka Hakimian CNM

## 2016-04-01 ENCOUNTER — Ambulatory Visit: Payer: Medicaid Other | Admitting: Certified Nurse Midwife

## 2016-04-04 NOTE — Telephone Encounter (Signed)
Call to patient- she only used Phentermine for 3 months. She has been dieting and working out at [employer]. The phentermine just gave her energy. She would like to continue another month if possible- but understands if she can't. She has started a new job and is not able to do all the things she was doing activity wise.

## 2016-04-05 ENCOUNTER — Other Ambulatory Visit: Payer: Self-pay | Admitting: Certified Nurse Midwife

## 2016-04-05 DIAGNOSIS — E669 Obesity, unspecified: Secondary | ICD-10-CM

## 2016-04-05 MED ORDER — PHENTERMINE HCL 37.5 MG PO CAPS: 37.5000 mg | ORAL_CAPSULE | ORAL | 3 refills | Status: DC

## 2016-04-05 NOTE — Telephone Encounter (Signed)
Please let her know that I have signed another prescription.  Thank you.  R.Evania Lyne CNM

## 2016-07-04 ENCOUNTER — Other Ambulatory Visit: Payer: Self-pay | Admitting: Family Medicine

## 2016-07-04 DIAGNOSIS — N644 Mastodynia: Secondary | ICD-10-CM

## 2016-07-12 ENCOUNTER — Ambulatory Visit
Admission: RE | Admit: 2016-07-12 | Discharge: 2016-07-12 | Disposition: A | Discharge status: Home / Self Care | Payer: Medicaid Other | Source: Ambulatory Visit | Attending: Family Medicine | Admitting: Family Medicine

## 2016-07-12 DIAGNOSIS — N644 Mastodynia: Secondary | ICD-10-CM

## 2016-09-22 ENCOUNTER — Encounter (HOSPITAL_COMMUNITY): Payer: Self-pay | Admitting: Emergency Medicine

## 2016-09-22 ENCOUNTER — Emergency Department (HOSPITAL_COMMUNITY): Payer: Self-pay

## 2016-09-22 ENCOUNTER — Emergency Department (HOSPITAL_COMMUNITY)
Admission: EM | Admit: 2016-09-22 | Discharge: 2016-09-22 | Disposition: A | Discharge status: Home / Self Care | Payer: Self-pay | Attending: Emergency Medicine | Admitting: Emergency Medicine

## 2016-09-22 DIAGNOSIS — Y929 Unspecified place or not applicable: Secondary | ICD-10-CM | POA: Insufficient documentation

## 2016-09-22 DIAGNOSIS — R519 Headache, unspecified: Secondary | ICD-10-CM

## 2016-09-22 DIAGNOSIS — R51 Headache: Secondary | ICD-10-CM | POA: Insufficient documentation

## 2016-09-22 DIAGNOSIS — Y939 Activity, unspecified: Secondary | ICD-10-CM | POA: Insufficient documentation

## 2016-09-22 DIAGNOSIS — M542 Cervicalgia: Secondary | ICD-10-CM | POA: Insufficient documentation

## 2016-09-22 DIAGNOSIS — Y999 Unspecified external cause status: Secondary | ICD-10-CM | POA: Insufficient documentation

## 2016-09-22 DIAGNOSIS — Z79899 Other long term (current) drug therapy: Secondary | ICD-10-CM | POA: Insufficient documentation

## 2016-09-22 MED ORDER — IBUPROFEN 800 MG PO TABS
800.0000 mg | ORAL_TABLET | Freq: Once | ORAL | Status: AC
  Administered 2016-09-22: 800 mg via ORAL
  Filled 2016-09-22: qty 1

## 2016-09-22 MED ORDER — IBUPROFEN 600 MG PO TABS: 600.0000 mg | ORAL_TABLET | Freq: Four times a day (QID) | ORAL | 0 refills | Status: DC | PRN

## 2016-09-22 MED ORDER — GI COCKTAIL ~~LOC~~
30.0000 mL | Freq: Once | ORAL | Status: AC
  Administered 2016-09-22: 30 mL via ORAL
  Filled 2016-09-22: qty 30
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
## 2016-09-22 NOTE — Discharge Instructions (Signed)
Take ibuprofen as prescribed. Apply ice to areas of pain to limit swelling. Follow up with your primary care doctor to ensure resolution of symptoms. Return to the ED as needed for new or worsening symptoms.
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
## 2017-01-09 ENCOUNTER — Other Ambulatory Visit (HOSPITAL_COMMUNITY)
Admission: RE | Admit: 2017-01-09 | Discharge: 2017-01-09 | Disposition: A | Discharge status: Home / Self Care | Payer: Medicaid Other | Source: Ambulatory Visit | Attending: Obstetrics | Admitting: Obstetrics

## 2017-01-09 ENCOUNTER — Ambulatory Visit: Payer: Medicaid Other | Admitting: Obstetrics

## 2017-01-09 ENCOUNTER — Ambulatory Visit (INDEPENDENT_AMBULATORY_CARE_PROVIDER_SITE_OTHER): Payer: Medicaid Other | Admitting: Obstetrics

## 2017-01-09 ENCOUNTER — Encounter: Payer: Self-pay | Admitting: Obstetrics

## 2017-01-09 VITALS — BP 122/78 | HR 85 | Ht 66.5 in | Wt 250.6 lb

## 2017-01-09 DIAGNOSIS — Z309 Encounter for contraceptive management, unspecified: Secondary | ICD-10-CM

## 2017-01-09 DIAGNOSIS — Z01419 Encounter for gynecological examination (general) (routine) without abnormal findings: Secondary | ICD-10-CM

## 2017-01-09 DIAGNOSIS — N898 Other specified noninflammatory disorders of vagina: Secondary | ICD-10-CM

## 2017-01-09 DIAGNOSIS — E6609 Other obesity due to excess calories: Secondary | ICD-10-CM

## 2017-01-09 DIAGNOSIS — N944 Primary dysmenorrhea: Secondary | ICD-10-CM

## 2017-01-09 MED ORDER — PHENTERMINE HCL 37.5 MG PO CAPS: 37.5000 mg | ORAL_CAPSULE | ORAL | 3 refills | Status: DC

## 2017-01-09 MED ORDER — IBUPROFEN 800 MG PO TABS: 800.0000 mg | ORAL_TABLET | Freq: Three times a day (TID) | ORAL | 5 refills | Status: DC | PRN

## 2017-01-09 NOTE — Addendum Note (Signed)
Addended by: Dalphine HandingGARDNER, Kasiah Manka L on: 01/09/2017 01:38 PM   Modules accepted: Orders

## 2017-01-09 NOTE — Addendum Note (Signed)
Addended by: Coral CeoHARPER, Rainn Zupko A on: 01/09/2017 03:05 PM   Modules accepted: Orders

## 2017-01-09 NOTE — Progress Notes (Signed)
Pt presents for annual, pap, and requests rf on Phentermine.
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
## 2017-01-11 LAB — CYTOLOGY - PAP
CHLAMYDIA, DNA PROBE: NEGATIVE
DIAGNOSIS: NEGATIVE
HPV: NOT DETECTED
Neisseria Gonorrhea: NEGATIVE

## 2017-01-27 ENCOUNTER — Encounter (HOSPITAL_COMMUNITY): Payer: Self-pay | Admitting: *Deleted

## 2017-01-27 ENCOUNTER — Ambulatory Visit (HOSPITAL_COMMUNITY)
Admission: EM | Admit: 2017-01-27 | Discharge: 2017-01-27 | Disposition: A | Discharge status: Home / Self Care | Payer: Medicaid Other | Attending: Internal Medicine | Admitting: Internal Medicine

## 2017-01-27 DIAGNOSIS — R1032 Left lower quadrant pain: Secondary | ICD-10-CM

## 2017-01-27 DIAGNOSIS — R109 Unspecified abdominal pain: Secondary | ICD-10-CM

## 2017-01-27 DIAGNOSIS — R1030 Lower abdominal pain, unspecified: Secondary | ICD-10-CM | POA: Insufficient documentation

## 2017-01-27 DIAGNOSIS — N898 Other specified noninflammatory disorders of vagina: Secondary | ICD-10-CM | POA: Insufficient documentation

## 2017-01-27 DIAGNOSIS — R1031 Right lower quadrant pain: Secondary | ICD-10-CM

## 2017-01-27 DIAGNOSIS — Z91013 Allergy to seafood: Secondary | ICD-10-CM | POA: Insufficient documentation

## 2017-01-27 DIAGNOSIS — Z880 Allergy status to penicillin: Secondary | ICD-10-CM | POA: Insufficient documentation

## 2017-01-27 MED ORDER — METRONIDAZOLE 500 MG PO TABS: 500.0000 mg | ORAL_TABLET | Freq: Two times a day (BID) | ORAL | 0 refills | Status: DC

## 2017-01-27 NOTE — ED Triage Notes (Signed)
Low   abd      Pain      Started      yest   Worse  Today   -      Pt   Reports  Vaginal   Discharge        As     Well

## 2017-01-27 NOTE — ED Provider Notes (Signed)
CSN: [id number]     Arrival date & time 01/27/17  1938 History   First MD Initiated Contact with Patient 01/27/17 2002     Chief Complaint  Patient presents with  . Abdominal Pain   (Consider location/radiation/quality/duration/timing/severity/associated sxs/prior Treatment) HPI  Brooke Chan is a 36 y.o. female presenting to UC with c/o lower abdominal pain that started yesterday but is worse today.  Mild cramping. Associated thin, white-clear malodorous vaginal discharge. Denies dysuria or hematuria. Denies concern for STDs. Denies fever, chills, n/v/d. Hx of recurrent BV and yeast infections around her menstrual cycle.    Past Medical History:  Diagnosis Date  . Anemia   . GERD (gastroesophageal reflux disease)   . Headache(784.0)    history migraine  . Obese    Past Surgical History:  Procedure Laterality Date  . CESAREAN SECTION    . CESAREAN SECTION WITH BILATERAL TUBAL LIGATION Bilateral 07/12/2013   Procedure: REPEAT CESAREAN SECTION WITH BILATERAL TUBAL LIGATION;  Surgeon: Brock Badharles A Harper, MD;  Location: [facility];  Service: Obstetrics;  Laterality: Bilateral;   Family History  Problem Relation Age of Onset  . Colitis Mother   . Seizures Mother   . Congestive Heart Failure Mother   . Hyperlipidemia Mother   . Hypertension Father   . Breast cancer Maternal Aunt   . Heart disease Maternal Grandfather   . Asthma Maternal Grandmother   . Heart attack Maternal Grandmother   . Cancer Unknown   . Kidney disease Unknown   . Brain cancer Unknown   . Prostate cancer Unknown    Social History  Substance Use Topics  . Smoking status: Never Smoker  . Smokeless tobacco: Never Used  . Alcohol use No   OB History    Gravida Para Term Preterm AB Living   2 2 2  0 0 2   SAB TAB Ectopic Multiple Live Births   0 0 0 0 2     Review of Systems  Constitutional: Negative for chills and fever.  Gastrointestinal: Positive for abdominal pain. Negative for diarrhea, nausea and  vomiting.  Genitourinary: Positive for vaginal discharge. Negative for dysuria, vaginal bleeding and vaginal pain.  Musculoskeletal: Negative for arthralgias, back pain and myalgias.  Skin: Negative for rash.    Allergies  Fish allergy; Shellfish allergy; and Penicillins  Home Medications   Prior to Admission medications   Medication Sig Start Date End Date Taking? Authorizing Provider  cetirizine (ZYRTEC) 10 MG tablet Take 1 tablet (10 mg total) by mouth daily. Patient not taking: Reported on 01/09/2017 10/24/15   Jerelyn ScottLinker, Martha, MD  ibuprofen (ADVIL,MOTRIN) 600 MG tablet Take 1 tablet (600 mg total) by mouth every 6 (six) hours as needed. 09/22/16   Antony MaduraHumes, Kelly, PA-C  ibuprofen (ADVIL,MOTRIN) 800 MG tablet Take 1 tablet (800 mg total) by mouth every 8 (eight) hours as needed. 01/09/17   Brock BadHarper, Charles A, MD  metroNIDAZOLE (FLAGYL) 500 MG tablet Take 1 tablet (500 mg total) by mouth 2 (two) times daily. One po bid x 7 days 01/27/17   Lurene ShadowPhelps, Eddie Payette O, PA-C  phentermine 37.5 MG capsule Take 1 capsule (37.5 mg total) by mouth every morning. 01/09/17   Brock BadHarper, Charles A, MD  Prenat w/o A-FeCbn-Meth-FA-DHA (OB COMPLETE GOLD) 27.5-1-200 MG CAPS Take 1 tablet by mouth daily. Patient not taking: Reported on 01/09/2017 02/16/16   Orvilla Cornwallenney, Rachelle A, CNM   Meds Ordered and Administered this Visit  Medications - No data to display  BP 108/64 (BP Location:  Left Arm)   Pulse (!) 102 Comment: reported elevated HR to PA [facility]  Temp 99 F (37.2 C) (Oral)   Resp 16   LMP 01/09/2017   SpO2 100%  No data found.   Physical Exam  Constitutional: She is oriented to person, place, and time. She appears well-developed and well-nourished. No distress.  HENT:  Head: Normocephalic and atraumatic.  Mouth/Throat: Oropharynx is clear and moist.  Eyes: EOM are normal.  Neck: Normal range of motion.  Cardiovascular: Normal rate and regular rhythm.   Pulmonary/Chest: Effort normal and breath sounds normal.  No respiratory distress. She has no wheezes. She has no rales.  Genitourinary: Pelvic exam was performed with patient supine. Cervix exhibits no motion tenderness and no discharge. Right adnexum displays no mass and no tenderness. Left adnexum displays no mass and no tenderness. Vaginal discharge (thin- clear to white, malodorous) found.  Genitourinary Comments: Chaperoned exam.  Musculoskeletal: Normal range of motion.  Neurological: She is alert and oriented to person, place, and time.  Skin: Skin is warm and dry. She is not diaphoretic.  Psychiatric: She has a normal mood and affect. Her behavior is normal.  Nursing note and vitals reviewed.   Urgent Care Course     Procedures (including critical care time)  Labs Review Labs Reviewed  CERVICOVAGINAL ANCILLARY ONLY    Imaging Review No results found.    MDM   1. Vaginal discharge   2. Bilateral lower abdominal cramping    Exam c/w BV Swab for BV and yeast sent to lab.  Rx: flagyl Pt will be notified if diflucan also needed F/u with PCP or GYN next week if not improving.     Lurene Shadow, [place] Chan 01/27/17 2057

## 2017-01-30 LAB — CERVICOVAGINAL ANCILLARY ONLY: Wet Prep (BD Affirm): POSITIVE — AB

## 2017-01-31 ENCOUNTER — Telehealth (HOSPITAL_COMMUNITY): Payer: Self-pay | Admitting: Internal Medicine

## 2017-01-31 MED ORDER — FLUCONAZOLE 150 MG PO TABS: 150.0000 mg | ORAL_TABLET | Freq: Once | ORAL | 0 refills | Status: AC

## 2017-01-31 NOTE — Telephone Encounter (Signed)
Clinical staff, please let patient know that test for candida (yeast) was positive.  Rx fluconazole was sent to the pharmacy of record, [facility] [address] [facility] [place].   Test for gardnerella (bacterial vaginosis) was also positive.  Rx metronidazole was given at the urgent care visit 7/6.  Recheck or followup with PCP for further evaluation if symptoms are not improving.  LM

## 2017-02-17 ENCOUNTER — Emergency Department (HOSPITAL_COMMUNITY)
Admission: EM | Admit: 2017-02-17 | Discharge: 2017-02-18 | Disposition: A | Discharge status: Home / Self Care | Payer: Medicaid Other | Attending: Emergency Medicine | Admitting: Emergency Medicine

## 2017-02-17 ENCOUNTER — Encounter (HOSPITAL_COMMUNITY): Payer: Self-pay | Admitting: Emergency Medicine

## 2017-02-17 DIAGNOSIS — K047 Periapical abscess without sinus: Secondary | ICD-10-CM | POA: Insufficient documentation

## 2017-02-17 DIAGNOSIS — R7303 Prediabetes: Secondary | ICD-10-CM | POA: Insufficient documentation

## 2017-02-17 DIAGNOSIS — Z79899 Other long term (current) drug therapy: Secondary | ICD-10-CM | POA: Insufficient documentation

## 2017-02-17 NOTE — ED Triage Notes (Signed)
Reports bad tooth on left lower side with pain going into throat, and left ear.  Swelling noted.  On a Zpack at this time.

## 2017-02-18 MED ORDER — OXYCODONE-ACETAMINOPHEN 5-325 MG PO TABS: 2.0000 | ORAL_TABLET | ORAL | 0 refills | Status: DC | PRN

## 2017-02-18 MED ORDER — OXYCODONE-ACETAMINOPHEN 5-325 MG PO TABS
2.0000 | ORAL_TABLET | Freq: Once | ORAL | Status: AC
  Administered 2017-02-18: 2 via ORAL
  Filled 2017-02-18: qty 2

## 2017-02-18 MED ORDER — CLINDAMYCIN HCL 150 MG PO CAPS
300.0000 mg | ORAL_CAPSULE | Freq: Once | ORAL | Status: AC
  Administered 2017-02-18: 300 mg via ORAL
  Filled 2017-02-18: qty 2

## 2017-02-18 MED ORDER — CLINDAMYCIN HCL 300 MG PO CAPS: 300.0000 mg | ORAL_CAPSULE | Freq: Four times a day (QID) | ORAL | 0 refills | Status: DC

## 2017-02-18 NOTE — Discharge Instructions (Signed)
See your dentist on Monday for recheck

## 2017-02-18 NOTE — ED Provider Notes (Signed)
[facility]-EMERGENCY DEPT Provider Note   CSN: [id number] Arrival date & time: 02/17/17  2247     History   Chief Complaint Chief Complaint  Patient presents with  . Dental Pain    HPI Brooke Chan is a 36 y.o. female.  The history is provided by the patient. No language interpreter was used.  Dental Pain   This is a new problem. The current episode started more than 1 week ago. The problem occurs constantly. The problem has been gradually worsening. The pain is moderate. She has tried nothing for the symptoms. The treatment provided no relief.   Pt complains aof a toothache.  Pt saw dentist and he put her on zithromax and hydrocodone.  Pt complains of increased swelling and pain Past Medical History:  Diagnosis Date  . Anemia   . GERD (gastroesophageal reflux disease)   . Headache(784.0)    history migraine  . Obese     Patient Active Problem List   Diagnosis Date Noted  . Prediabetes 03/08/2016  . Disruption of cesarean wound, postpartum 07/27/2013  . Other complication of obstetrical surgical wounds, postpartum condition or complication 07/26/2013  . Cesarean delivery delivered 07/12/2013  . Pain aggravated by activities of daily living 03/07/2013  . Candidiasis of vulva and vagina 03/07/2013  . Pain 12/11/2012    Past Surgical History:  Procedure Laterality Date  . CESAREAN SECTION    . CESAREAN SECTION WITH BILATERAL TUBAL LIGATION Bilateral 07/12/2013   Procedure: REPEAT CESAREAN SECTION WITH BILATERAL TUBAL LIGATION;  Surgeon: Brock Badharles A Harper, MD;  Location: [facility];  Service: Obstetrics;  Laterality: Bilateral;    OB History    Gravida Para Term Preterm AB Living   2 2 2  0 0 2   SAB TAB Ectopic Multiple Live Births   0 0 0 0 2       Home Medications    Prior to Admission medications   Medication Sig Start Date End Date Taking? Authorizing Provider  cetirizine (ZYRTEC) 10 MG tablet Take 1 tablet (10 mg total) by mouth daily. Patient not taking:  Reported on 01/09/2017 10/24/15   Phillis HaggisMabe, Martha L, MD  clindamycin (CLEOCIN) 300 MG capsule Take 1 capsule (300 mg total) by mouth every 6 (six) hours. 02/18/17   Elson AreasSofia, Tayson Schnelle K, PA-C  ibuprofen (ADVIL,MOTRIN) 600 MG tablet Take 1 tablet (600 mg total) by mouth every 6 (six) hours as needed. 09/22/16   Antony MaduraHumes, Kelly, PA-C  ibuprofen (ADVIL,MOTRIN) 800 MG tablet Take 1 tablet (800 mg total) by mouth every 8 (eight) hours as needed. 01/09/17   Brock BadHarper, Charles A, MD  metroNIDAZOLE (FLAGYL) 500 MG tablet Take 1 tablet (500 mg total) by mouth 2 (two) times daily. One po bid x 7 days 01/27/17   Lurene ShadowPhelps, Erin O, PA-C  oxyCODONE-acetaminophen (PERCOCET/ROXICET) 5-325 MG tablet Take 2 tablets by mouth every 4 (four) hours as needed for severe pain. 02/18/17   Elson AreasSofia, Kyair Ditommaso K, PA-C  phentermine 37.5 MG capsule Take 1 capsule (37.5 mg total) by mouth every morning. 01/09/17   Brock BadHarper, Charles A, MD  Prenat w/o A-FeCbn-Meth-FA-DHA (OB COMPLETE GOLD) 27.5-1-200 MG CAPS Take 1 tablet by mouth daily. Patient not taking: Reported on 01/09/2017 02/16/16   Roe Coombsenney, Rachelle A, CNM    Family History Family History  Problem Relation Age of Onset  . Colitis Mother   . Seizures Mother   . Congestive Heart Failure Mother   . Hyperlipidemia Mother   . Hypertension Father   . Breast cancer  Maternal Aunt   . Heart disease Maternal Grandfather   . Asthma Maternal Grandmother   . Heart attack Maternal Grandmother   . Cancer Unknown   . Kidney disease Unknown   . Brain cancer Unknown   . Prostate cancer Unknown     Social History Social History  Substance Use Topics  . Smoking status: Never Smoker  . Smokeless tobacco: Never Used  . Alcohol use No     Allergies   Fish allergy; Shellfish allergy; and Penicillins   Review of Systems Review of Systems  All other systems reviewed and are negative.    Physical Exam Updated Vital Signs BP 134/86   Pulse (!) 120   Temp 98.4 F (36.9 C) (Oral)   Resp 18   Ht 5\' 6"   (1.676 m)   Wt 108.9 kg (240 lb)   LMP 02/13/2017   SpO2 100%   BMI 38.74 kg/m   Physical Exam  Constitutional: She appears well-developed and well-nourished. No distress.  HENT:  Head: Normocephalic and atraumatic.  impacted left lower 3rd molar, swelling around gum.   Eyes: Conjunctivae are normal.  Neck: Neck supple.  Cardiovascular: Normal rate and regular rhythm.   No murmur heard. Pulmonary/Chest: Effort normal and breath sounds normal. No respiratory distress.  Abdominal: Soft. There is no tenderness.  Musculoskeletal: She exhibits no edema.  Neurological: She is alert.  Skin: Skin is warm and dry.  Psychiatric: She has a normal mood and affect.  Nursing note and vitals reviewed.    ED Treatments / Results  Labs (all labs ordered are listed, but only abnormal results are displayed) Labs Reviewed - No data to display  EKG  EKG Interpretation None      Pt advised to stop zithromax and hydrocodone.  Pt given rx for percocet and clindamycin.  Pt advised to call her dentist on Monday to be seen for recheck Radiology No results found.  Procedures Procedures (including critical care time)  Medications Ordered in ED Medications  clindamycin (CLEOCIN) capsule 300 mg (300 mg Oral Given 02/18/17 0052)  oxyCODONE-acetaminophen (PERCOCET/ROXICET) 5-325 MG per tablet 2 tablet (2 tablets Oral Given 02/18/17 0052)     Initial Impression / Assessment and Plan / ED Course  I have reviewed the triage vital signs and the nursing notes.  Pertinent labs & imaging results that were available during my care of the patient were reviewed by me and considered in my medical decision making (see chart for details).       Final Clinical Impressions(s) / ED Diagnoses   Final diagnoses:  Dental infection    New Prescriptions New Prescriptions   CLINDAMYCIN (CLEOCIN) 300 MG CAPSULE    Take 1 capsule (300 mg total) by mouth every 6 (six) hours.   OXYCODONE-ACETAMINOPHEN  (PERCOCET/ROXICET) 5-325 MG TABLET    Take 2 tablets by mouth every 4 (four) hours as needed for severe pain.  An After Visit Summary was printed and given to the patient.   Elson AreasSofia, Karel Mowers K, [place]PA-C 02/18/17 [id number]    Rolan BuccoBelfi, Melanie, MD 02/18/17 1459

## 2017-04-05 ENCOUNTER — Other Ambulatory Visit: Payer: Self-pay | Admitting: *Deleted

## 2017-04-05 DIAGNOSIS — B379 Candidiasis, unspecified: Secondary | ICD-10-CM

## 2017-04-05 MED ORDER — FLUCONAZOLE 150 MG PO TABS: 150.0000 mg | ORAL_TABLET | Freq: Once | ORAL | 0 refills | Status: DC

## 2017-04-05 NOTE — Progress Notes (Signed)
Pt called to office requesting tx for yeast infection.  Return call to pt. Pt states she recently took abx for tooth infection and now has yeast infection. Diflucan sent per protocol.

## 2017-07-09 ENCOUNTER — Encounter (HOSPITAL_COMMUNITY): Payer: Self-pay | Admitting: *Deleted

## 2017-07-09 ENCOUNTER — Emergency Department (HOSPITAL_COMMUNITY): Payer: 59

## 2017-07-09 ENCOUNTER — Other Ambulatory Visit: Payer: Self-pay

## 2017-07-09 ENCOUNTER — Emergency Department (HOSPITAL_COMMUNITY)
Admission: EM | Admit: 2017-07-09 | Discharge: 2017-07-09 | Disposition: A | Discharge status: Home / Self Care | Payer: 59 | Attending: Emergency Medicine | Admitting: Emergency Medicine

## 2017-07-09 DIAGNOSIS — M25512 Pain in left shoulder: Secondary | ICD-10-CM

## 2017-07-09 DIAGNOSIS — Z79899 Other long term (current) drug therapy: Secondary | ICD-10-CM | POA: Insufficient documentation

## 2017-07-09 DIAGNOSIS — Y9389 Activity, other specified: Secondary | ICD-10-CM | POA: Insufficient documentation

## 2017-07-09 DIAGNOSIS — M545 Low back pain: Secondary | ICD-10-CM | POA: Insufficient documentation

## 2017-07-09 DIAGNOSIS — R51 Headache: Secondary | ICD-10-CM | POA: Diagnosis not present

## 2017-07-09 DIAGNOSIS — Y92410 Unspecified street and highway as the place of occurrence of the external cause: Secondary | ICD-10-CM | POA: Insufficient documentation

## 2017-07-09 MED ORDER — IBUPROFEN 800 MG PO TABS
800.0000 mg | ORAL_TABLET | Freq: Once | ORAL | Status: AC
  Administered 2017-07-09: 800 mg via ORAL
  Filled 2017-07-09: qty 1

## 2017-07-09 MED ORDER — METHOCARBAMOL 500 MG PO TABS: 500.0000 mg | ORAL_TABLET | Freq: Every evening | ORAL | 0 refills | Status: DC | PRN

## 2017-07-09 MED ORDER — NAPROXEN 500 MG PO TABS: 500.0000 mg | ORAL_TABLET | Freq: Two times a day (BID) | ORAL | 0 refills | Status: DC
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
## 2017-07-09 NOTE — ED Notes (Signed)
Patient transported to X-ray 
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
## 2017-08-16 ENCOUNTER — Telehealth: Payer: Self-pay | Admitting: *Deleted

## 2017-08-16 NOTE — Telephone Encounter (Signed)
Pt called in requesting a prescription for a yeast infection sent to the [facility] on file, please advise.Marland Kitchen..Marland Kitchen

## 2017-08-17 ENCOUNTER — Other Ambulatory Visit: Payer: Self-pay | Admitting: Obstetrics

## 2017-08-17 DIAGNOSIS — B3731 Acute candidiasis of vulva and vagina: Secondary | ICD-10-CM

## 2017-08-17 DIAGNOSIS — B373 Candidiasis of vulva and vagina: Secondary | ICD-10-CM

## 2017-08-17 MED ORDER — FLUCONAZOLE 150 MG PO TABS: 150.0000 mg | ORAL_TABLET | Freq: Once | ORAL | 0 refills | Status: AC

## 2017-08-17 NOTE — Telephone Encounter (Signed)
Diflucan Rx for yeast.  Please call patient.

## 2017-08-18 NOTE — Telephone Encounter (Signed)
Pt notified Diflucan is at her pharmacy.

## 2017-10-12 ENCOUNTER — Ambulatory Visit (INDEPENDENT_AMBULATORY_CARE_PROVIDER_SITE_OTHER): Payer: 59 | Admitting: Certified Nurse Midwife

## 2017-10-12 ENCOUNTER — Encounter: Payer: Self-pay | Admitting: Certified Nurse Midwife

## 2017-10-12 VITALS — BP 130/84 | HR 101 | Wt 275.8 lb

## 2017-10-12 DIAGNOSIS — R7303 Prediabetes: Secondary | ICD-10-CM

## 2017-10-12 DIAGNOSIS — E6609 Other obesity due to excess calories: Secondary | ICD-10-CM | POA: Diagnosis not present

## 2017-10-12 DIAGNOSIS — B373 Candidiasis of vulva and vagina: Secondary | ICD-10-CM | POA: Diagnosis not present

## 2017-10-12 DIAGNOSIS — B3731 Acute candidiasis of vulva and vagina: Secondary | ICD-10-CM

## 2017-10-12 DIAGNOSIS — R5382 Chronic fatigue, unspecified: Secondary | ICD-10-CM | POA: Diagnosis not present

## 2017-10-12 MED ORDER — TERCONAZOLE 0.8 % VA CREA: 1.0000 | TOPICAL_CREAM | Freq: Every day | VAGINAL | 0 refills | Status: DC

## 2017-10-12 MED ORDER — FLUCONAZOLE 200 MG PO TABS: 200.0000 mg | ORAL_TABLET | Freq: Once | ORAL | 0 refills | Status: AC

## 2017-10-12 MED ORDER — PHENTERMINE HCL 37.5 MG PO CAPS: 37.5000 mg | ORAL_CAPSULE | ORAL | 3 refills | Status: DC

## 2017-10-12 NOTE — Progress Notes (Signed)
Patient ID: Brooke Chan, female   DOB: April 16, 1981, 37 y.o.   MRN: [id number]  Chief Complaint  Patient presents with  . Gynecologic Exam    HPI DAMYA COMLEY is a 37 y.o. female.  Here with c/o vaginal discharge with every period.  Currently on her period and bleeding heavy.  Reports yeast infection with every period, currently having vaginal itching.  Discussed diet, exercise.  Is currently employed full time with [employer] and very happy.  Children are in school.  01/09/17 was annual exam with pap smear: WNL.   HPI  Past Medical History:  Diagnosis Date  . Anemia   . GERD (gastroesophageal reflux disease)   . Headache(784.0)    history migraine  . Obese     Past Surgical History:  Procedure Laterality Date  . CESAREAN SECTION    . CESAREAN SECTION WITH BILATERAL TUBAL LIGATION Bilateral 07/12/2013   Procedure: REPEAT CESAREAN SECTION WITH BILATERAL TUBAL LIGATION;  Surgeon: Brock Bad, MD;  Location: [facility];  Service: Obstetrics;  Laterality: Bilateral;    Family History  Problem Relation Age of Onset  . Colitis Mother   . Seizures Mother   . Congestive Heart Failure Mother   . Hyperlipidemia Mother   . Hypertension Father   . Breast cancer Maternal Aunt   . Heart disease Maternal Grandfather   . Asthma Maternal Grandmother   . Heart attack Maternal Grandmother   . Cancer Unknown   . Kidney disease Unknown   . Brain cancer Unknown   . Prostate cancer Unknown     Social History Social History   Tobacco Use  . Smoking status: Never Smoker  . Smokeless tobacco: Never Used  Substance Use Topics  . Alcohol use: No    Alcohol/week: 0.0 oz  . Drug use: No    Allergies  Allergen Reactions  . Fish Allergy Hives  . Shellfish Allergy Hives  . Penicillins Hives    Current Outpatient Medications  Medication Sig Dispense Refill  . methocarbamol (ROBAXIN) 500 MG tablet Take 1 tablet (500 mg total) by mouth at bedtime as needed for muscle spasms. 20 tablet 0   . Multiple Vitamin (MULTIVITAMIN WITH MINERALS) TABS tablet Take 1 tablet by mouth daily.    . naproxen (NAPROSYN) 500 MG tablet Take 1 tablet (500 mg total) by mouth 2 (two) times daily with a meal. 30 tablet 0  . fluconazole (DIFLUCAN) 200 MG tablet Take 1 tablet (200 mg total) by mouth once for 1 dose. Repeat dose in 48-72 hours. 3 tablet 0  . phentermine 37.5 MG capsule Take 1 capsule (37.5 mg total) by mouth every morning. 30 capsule 3  . terconazole (TERAZOL 3) 0.8 % vaginal cream Place 1 applicator vaginally at bedtime. 20 g 0   No current facility-administered medications for this visit.     Review of Systems Review of Systems Constitutional: negative for fatigue and weight loss Respiratory: negative for cough and wheezing Cardiovascular: negative for chest pain, fatigue and palpitations Gastrointestinal: negative for abdominal pain and change in bowel habits Genitourinary:+ vaginal discharge Integument/breast: negative for nipple discharge Musculoskeletal:negative for myalgias Neurological: negative for gait problems and tremors Behavioral/Psych: negative for abusive relationship, depression Endocrine: negative for temperature intolerance      Blood pressure 130/84, pulse (!) 101, weight 275 lb 12.8 oz (125.1 kg), last menstrual period 10/10/2017, currently breastfeeding.  Physical Exam Physical Exam General:   alert  PE: deferred   100% of 20 min visit spent on  counseling and coordination of care.   Data Reviewed Previous medical records, meds, labs  Assessment     1. Prediabetes    F/U A1C - Hemoglobin A1c  2. Morbid obesity (HCC)      - Amb Referral to Bariatric Surgery  3. Yeast vaginitis      - fluconazole (DIFLUCAN) 200 MG tablet; Take 1 tablet (200 mg total) by mouth once for 1 dose. Repeat dose in 48-72 hours.  Dispense: 3 tablet; Refill: 0 - terconazole (TERAZOL 3) 0.8 % vaginal cream; Place 1 applicator vaginally at bedtime.  Dispense: 20 g; Refill:  0  4. Chronic fatigue     - Vitamin D (25 hydroxy)  5. Obesity due to excess calories without serious comorbidity, unspecified classification     - phentermine 37.5 MG capsule; Take 1 capsule (37.5 mg total) by mouth every morning.  Dispense: 30 capsule; Refill: 3.     Plan    Orders Placed This Encounter  Procedures  . Hemoglobin A1c  . Vitamin D (25 hydroxy)  . Amb Referral to Bariatric Surgery    Referral Priority:   Routine    Referral Type:   Consultation    Referred to Provider:   Wilder GladeBeasley, Caren D, MD    Number of Visits Requested:   1   Meds ordered this encounter  Medications  . fluconazole (DIFLUCAN) 200 MG tablet    Sig: Take 1 tablet (200 mg total) by mouth once for 1 dose. Repeat dose in 48-72 hours.    Dispense:  3 tablet    Refill:  0  . terconazole (TERAZOL 3) 0.8 % vaginal cream    Sig: Place 1 applicator vaginally at bedtime.    Dispense:  20 g    Refill:  0  . phentermine 37.5 MG capsule    Sig: Take 1 capsule (37.5 mg total) by mouth every morning.    Dispense:  30 capsule    Refill:  3    Follow up as needed.

## 2017-10-13 LAB — VITAMIN D 25 HYDROXY (VIT D DEFICIENCY, FRACTURES): VIT D 25 HYDROXY: 26.7 ng/mL — AB (ref 30.0–100.0)

## 2017-10-13 LAB — HEMOGLOBIN A1C
Est. average glucose Bld gHb Est-mCnc: 117 mg/dL
Hgb A1c MFr Bld: 5.7 % — ABNORMAL HIGH (ref 4.8–5.6)

## 2017-10-19 ENCOUNTER — Ambulatory Visit: Payer: 59 | Admitting: Obstetrics

## 2017-10-24 ENCOUNTER — Other Ambulatory Visit: Payer: Self-pay | Admitting: Certified Nurse Midwife

## 2017-10-24 DIAGNOSIS — E559 Vitamin D deficiency, unspecified: Secondary | ICD-10-CM

## 2017-10-24 MED ORDER — VITAMIN D (ERGOCALCIFEROL) 1.25 MG (50000 UNIT) PO CAPS: 50000.0000 [IU] | ORAL_CAPSULE | ORAL | 2 refills | Status: DC

## 2017-12-15 IMAGING — US US TRANSVAGINAL NON-OB
1 series · 15 of 25 positions shown · non-contrast
Comparison: None in PACs

CLINICAL DATA: Abnormal uterine bleeding, history of 2 previous
Cesarean sections, obesity.



[Series 1: us transvaginal non-ob · 15 of 64 slices shown]
[im 1/64]
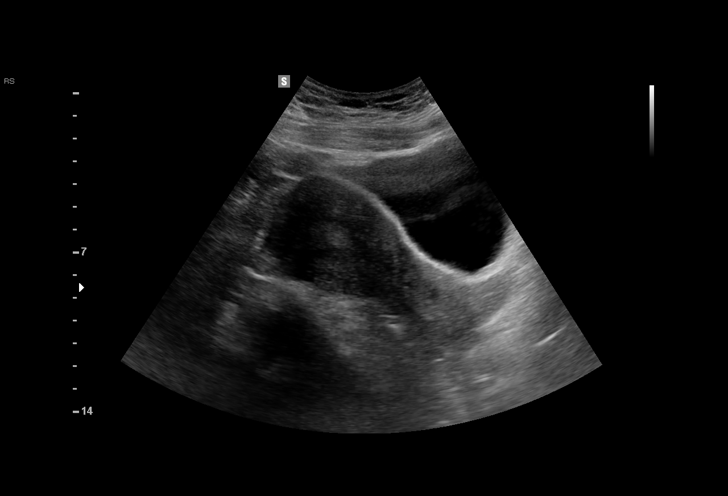
[im 6/64]
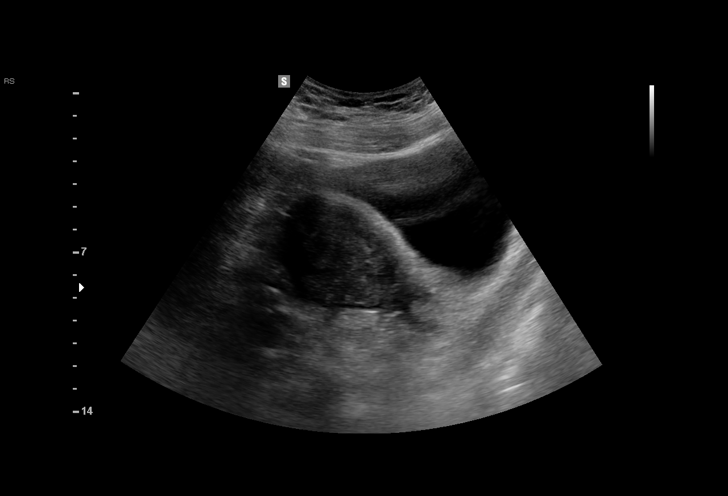
[im 11/64]
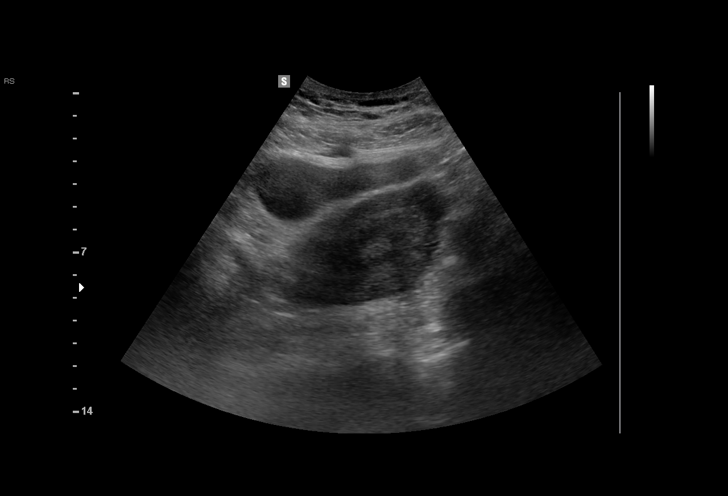
[im 14/64]
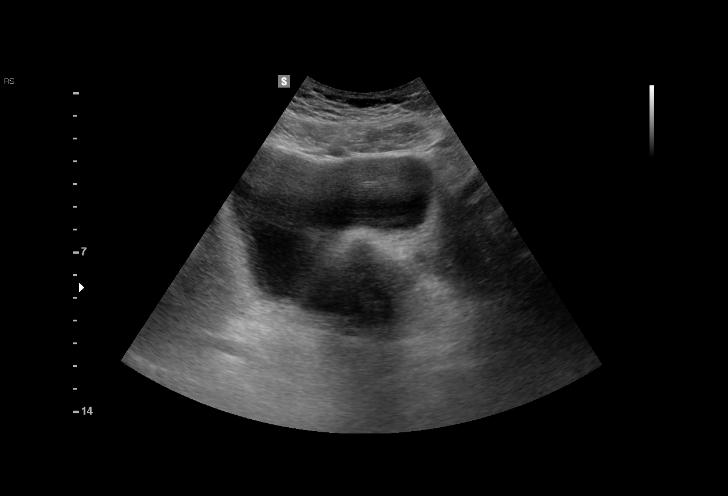
[im 19/64]
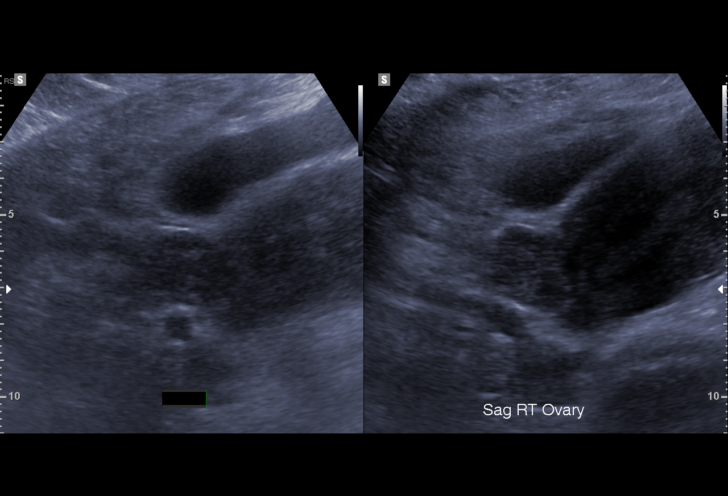
[im 24/64]
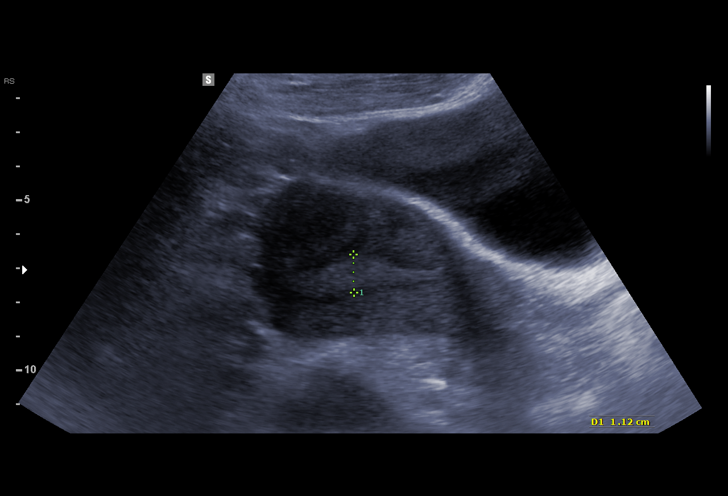
[im 27/64]
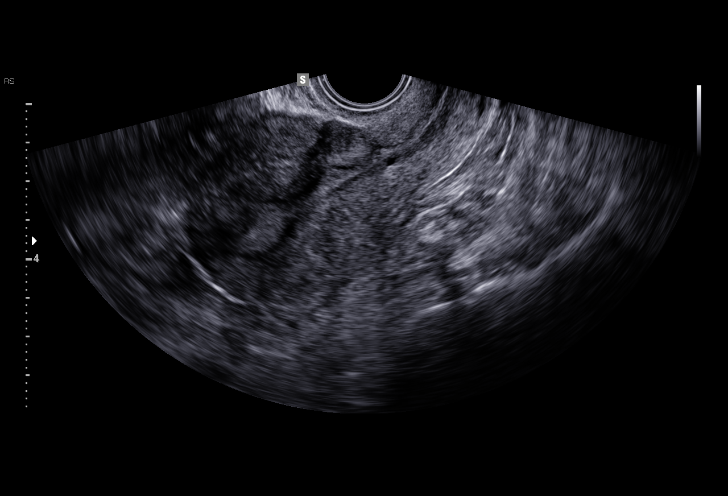
[im 32/64]
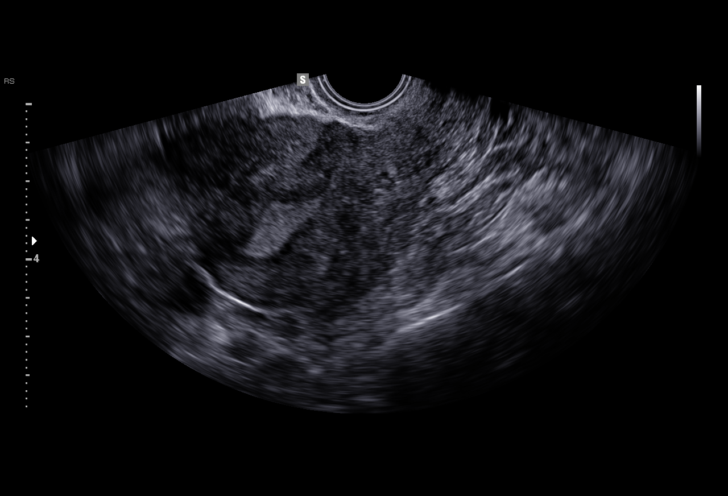
[im 37/64]
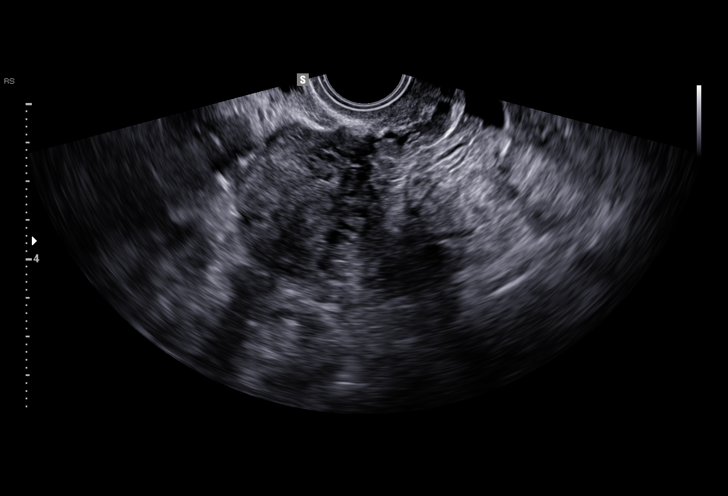
[im 40/64]
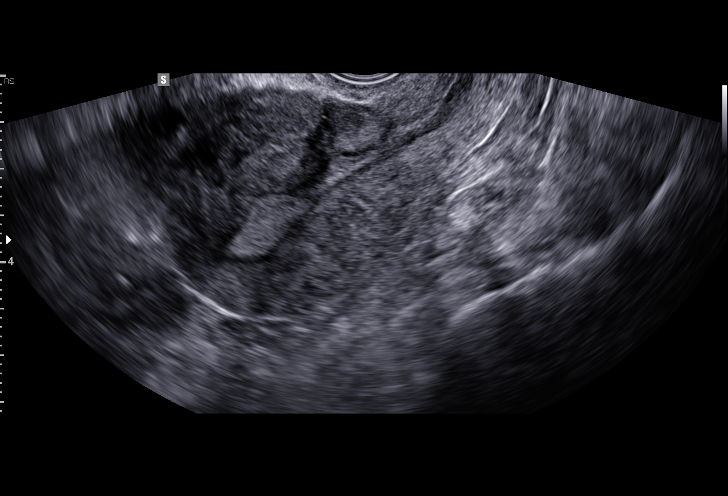
[im 45/64]
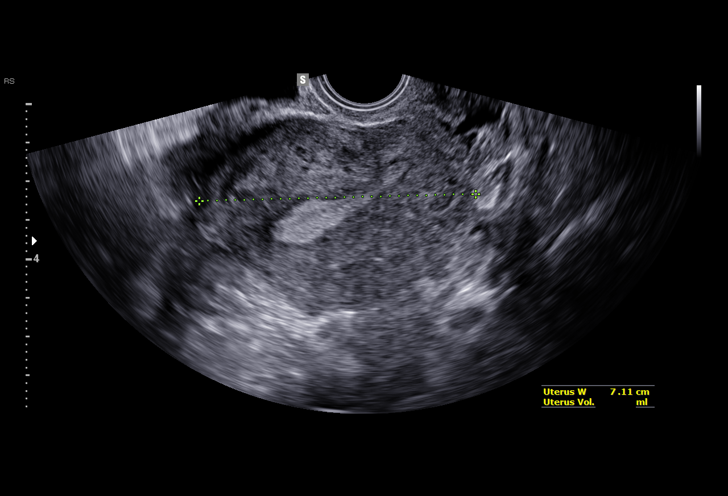
[im 50/64]
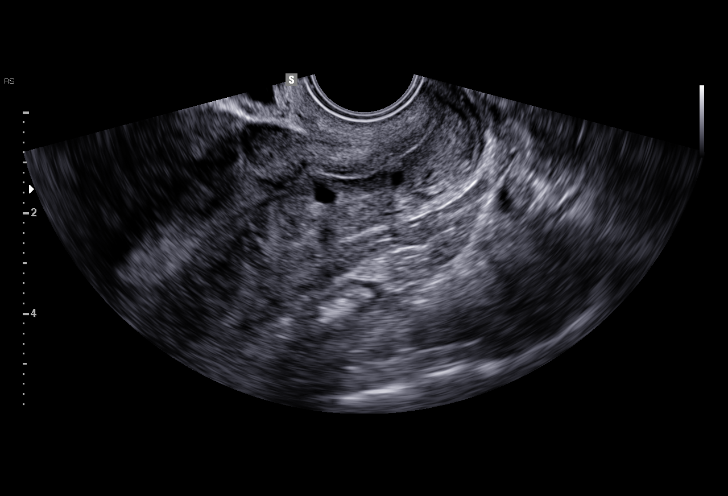
[im 53/64]
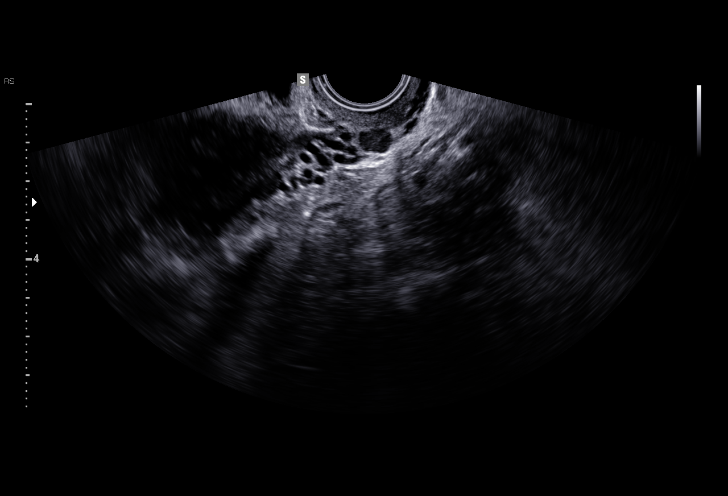
[im 58/64]
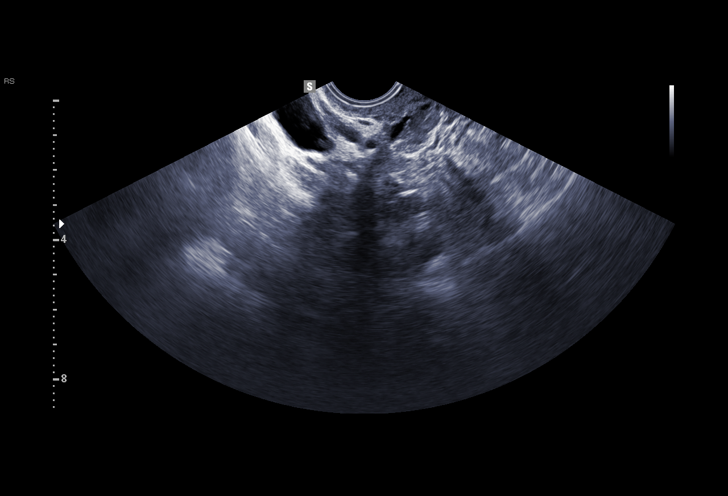
[im 64/64]
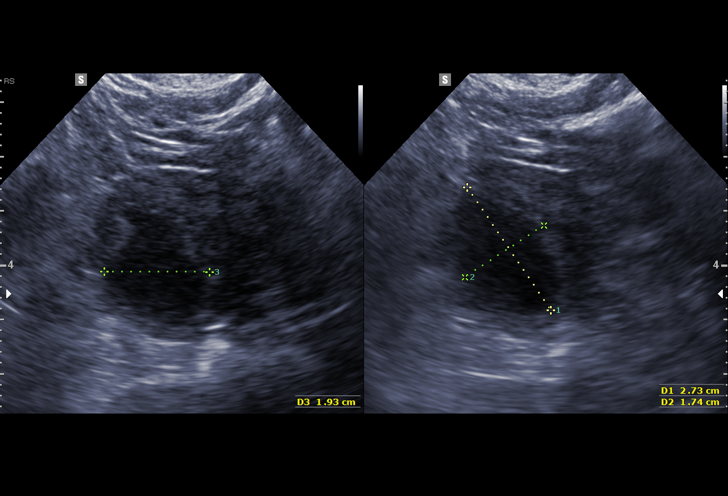

[15 of 25 positions shown; findings below may reference images not displayed]

FINDINGS: Uterus

Measurements: 10.6 x 4.8 by 5.9 cm. The echotexture is
heterogeneous. No discrete fibroids are observed.

Endometrium

Thickness: 10.8 mm.. No endometrial masses or fluid collections are
observed.

Right ovary

Measurements: 2.9 x 1.3 x 1.4 cm. Normal appearance/no adnexal mass.

Left ovary

Measurements: 4.5 x 2.7 x 3.0 cm.. The left ovary contains a simple
appearing cyst measuring 2.7 x 1.7 x 1.9 cm.

Other findings

No abnormal free fluid.
IMPRESSION: 1. Heterogeneous uterine echotexture without discrete fibroids.
Normal thickness of the endometrium. If bleeding remains
unresponsive to hormonal or medical therapy, sonohysterogram should
be considered for focal lesion work-up. (Ref: Radiological
Reasoning: Algorithmic Workup of Abnormal Vaginal Bleeding with
Endovaginal Sonography and Sonohysterography. AJR [id number]; 191:S68-73)
2. Normal appearance of the right ovary. Simple appearing cyst in
the left ovary measuring 2.7 cm in greatest dimension.

## 2018-02-05 ENCOUNTER — Telehealth: Payer: Self-pay

## 2018-02-05 DIAGNOSIS — N760 Acute vaginitis: Secondary | ICD-10-CM

## 2018-02-05 MED ORDER — FLUCONAZOLE 150 MG PO TABS
150.0000 mg | ORAL_TABLET | Freq: Once | ORAL | 1 refills | Status: AC
Start: 2018-02-05 — End: 2018-02-05

## 2018-02-05 NOTE — Telephone Encounter (Signed)
Spoke with pt over the phone who states she is having sx of a yeast infection, discharge and itching. Pt has not been seen for annual since 12/2016. After speaking with Dr. Clearance CootsHarper, I sent rx diflucan over to pts pharmacy and advised her to call and schedule annual. Pt verbalized understanding.

## 2018-05-22 ENCOUNTER — Encounter: Payer: Self-pay | Admitting: Obstetrics

## 2018-05-22 ENCOUNTER — Ambulatory Visit (INDEPENDENT_AMBULATORY_CARE_PROVIDER_SITE_OTHER): Payer: 59 | Admitting: Obstetrics

## 2018-05-22 ENCOUNTER — Telehealth: Payer: Self-pay

## 2018-05-22 ENCOUNTER — Other Ambulatory Visit: Payer: Self-pay | Admitting: Obstetrics

## 2018-05-22 VITALS — BP 107/67 | HR 108 | Resp 20 | Ht 65.5 in | Wt 267.9 lb

## 2018-05-22 DIAGNOSIS — Z113 Encounter for screening for infections with a predominantly sexual mode of transmission: Secondary | ICD-10-CM

## 2018-05-22 DIAGNOSIS — Z23 Encounter for immunization: Secondary | ICD-10-CM

## 2018-05-22 DIAGNOSIS — N944 Primary dysmenorrhea: Secondary | ICD-10-CM

## 2018-05-22 DIAGNOSIS — E6609 Other obesity due to excess calories: Secondary | ICD-10-CM

## 2018-05-22 DIAGNOSIS — Z1151 Encounter for screening for human papillomavirus (HPV): Secondary | ICD-10-CM

## 2018-05-22 DIAGNOSIS — Z01419 Encounter for gynecological examination (general) (routine) without abnormal findings: Secondary | ICD-10-CM | POA: Diagnosis not present

## 2018-05-22 DIAGNOSIS — Z124 Encounter for screening for malignant neoplasm of cervix: Secondary | ICD-10-CM | POA: Diagnosis not present

## 2018-05-22 DIAGNOSIS — N939 Abnormal uterine and vaginal bleeding, unspecified: Secondary | ICD-10-CM

## 2018-05-22 DIAGNOSIS — D508 Other iron deficiency anemias: Secondary | ICD-10-CM

## 2018-05-22 DIAGNOSIS — R69 Illness, unspecified: Secondary | ICD-10-CM | POA: Diagnosis not present

## 2018-05-22 MED ORDER — PHENTERMINE HCL 37.5 MG PO CAPS: 37.5000 mg | ORAL_CAPSULE | ORAL | 2 refills | Status: DC

## 2018-05-22 MED ORDER — PRENATAL PLUS 27-1 MG PO TABS: 1.0000 | ORAL_TABLET | Freq: Every day | ORAL | 11 refills | Status: DC

## 2018-05-22 MED ORDER — IBUPROFEN 800 MG PO TABS: 800.0000 mg | ORAL_TABLET | Freq: Three times a day (TID) | ORAL | 5 refills | Status: DC | PRN

## 2018-05-22 NOTE — Addendum Note (Signed)
Addended by: Areta Haber B on: 05/22/2018 03:30 PM   Modules accepted: Orders

## 2018-05-22 NOTE — Telephone Encounter (Signed)
Patient called and would like to know if she can get phentermine tablets instead of the capsules that were prescribed to her.

## 2018-05-22 NOTE — Telephone Encounter (Signed)
I called her Pharmacy and changed Rx to Phentermine tablets.

## 2018-05-22 NOTE — Progress Notes (Addendum)
Subjective:        Brooke Chan is a 37 y.o. female here for a routine exam.  Current complaints: Heavy and painful 10 day periods.    Personal health questionnaire:  Is patient Ashkenazi Jewish, have a family history of breast and/or ovarian cancer: no Is there a family history of uterine cancer diagnosed at age < 21, gastrointestinal cancer, urinary tract cancer, family member who is a Personnel officer syndrome-associated carrier: no Is the patient overweight and hypertensive, family history of diabetes, personal history of gestational diabetes, preeclampsia or PCOS: no Is patient over 23, have PCOS,  family history of premature CHD under age 85, diabetes, smoke, have hypertension or peripheral artery disease:  no At any time, has a partner hit, kicked or otherwise hurt or frightened you?: no Over the past 2 weeks, have you felt down, depressed or hopeless?: no Over the past 2 weeks, have you felt little interest or pleasure in doing things?:no   Gynecologic History Patient's last menstrual period was 05/21/2018 (exact date). Contraception: tubal ligation Last Pap: 2018. Results were: normal Last mammogram: n/a. Results were: n/a  Obstetric History OB History  Gravida Para Term Preterm AB Living  2 2 2  0 0 2  SAB TAB Ectopic Multiple Live Births  0 0 0 0 2    # Outcome Date GA Lbr Len/2nd Weight Sex Delivery Anes PTL Lv  2 Term 07/12/13 [redacted]w[redacted]d   F CS-LTranv Spinal  LIV  1 Term 11/22/00    M CS-LTranv Spinal  LIV    Past Medical History:  Diagnosis Date  . Anemia   . GERD (gastroesophageal reflux disease)   . Headache(784.0)    history migraine  . Obese     Past Surgical History:  Procedure Laterality Date  . CESAREAN SECTION    . CESAREAN SECTION WITH BILATERAL TUBAL LIGATION Bilateral 07/12/2013   Procedure: REPEAT CESAREAN SECTION WITH BILATERAL TUBAL LIGATION;  Surgeon: Brock Bad, MD;  Location: [facility];  Service: Obstetrics;  Laterality: Bilateral;      Current Outpatient Medications:  Marland Kitchen  Multiple Vitamin (MULTIVITAMIN WITH MINERALS) TABS tablet, Take 1 tablet by mouth daily., Disp: , Rfl:  .  ibuprofen (ADVIL,MOTRIN) 800 MG tablet, Take 1 tablet (800 mg total) by mouth every 8 (eight) hours as needed., Disp: 30 tablet, Rfl: 5 .  phentermine 37.5 MG capsule, Take 1 capsule (37.5 mg total) by mouth every morning., Disp: 30 capsule, Rfl: 2 .  prenatal vitamin w/FE, FA (PRENATAL 1 + 1) 27-1 MG TABS tablet, Take 1 tablet by mouth daily before breakfast., Disp: 30 each, Rfl: 11 Allergies  Allergen Reactions  . Fish Allergy Hives  . Shellfish Allergy Hives  . Penicillins Hives    Social History   Tobacco Use  . Smoking status: Never Smoker  . Smokeless tobacco: Never Used  Substance Use Topics  . Alcohol use: No    Alcohol/week: 0.0 standard drinks    Family History  Problem Relation Age of Onset  . Colitis Mother   . Seizures Mother   . Congestive Heart Failure Mother   . Hyperlipidemia Mother   . Hypertension Father   . Breast cancer Maternal Aunt   . Heart disease Maternal Grandfather   . Asthma Maternal Grandmother   . Heart attack Maternal Grandmother   . Cancer Unknown   . Kidney disease Unknown   . Brain cancer Unknown   . Prostate cancer Unknown       Review of  Systems  Constitutional: negative for fatigue and weight loss Respiratory: negative for cough and wheezing Cardiovascular: negative for chest pain, fatigue and palpitations Gastrointestinal: negative for abdominal pain and change in bowel habits Musculoskeletal:negative for myalgias Neurological: negative for gait problems and tremors Behavioral/Psych: negative for abusive relationship, depression Endocrine: negative for temperature intolerance    Genitourinary:positive for abnormal menstrual periods.  Negative for genital lesions, hot flashes, sexual problems and vaginal discharge Integument/breast: negative for breast lump, breast tenderness, nipple  discharge and skin lesion(s)    Objective:       BP 107/67 (BP Location: Left Arm, Patient Position: Sitting, Cuff Size: Large)   Pulse (!) 108   Resp 20   Ht 5' 5.5" (1.664 m)   Wt 267 lb 14.4 oz (121.5 kg)   LMP 05/21/2018 (Exact Date)   Breastfeeding? No   BMI 43.90 kg/m  General:   alert  Skin:   no rash or abnormalities  Lungs:   clear to auscultation bilaterally  Heart:   regular rate and rhythm, S1, S2 normal, no murmur, click, rub or gallop  Breasts:   normal without suspicious masses, skin or nipple changes or axillary nodes  Abdomen:  normal findings: no organomegaly, soft, non-tender and no hernia  Pelvis:  External genitalia: normal general appearance Urinary system: urethral meatus normal and bladder without fullness, nontender Vaginal: normal without tenderness, induration or masses Cervix: normal appearance Adnexa: normal bimanual exam Uterus: anteverted and non-tender, normal size   Lab Review Urine pregnancy test Labs reviewed yes Radiologic studies reviewed yes  50% of 20 min visit spent on counseling and coordination of care.   Assessment:     1. Encounter for routine gynecological examination with Papanicolaou smear of cervix Rx: - Cytology - PAP( [place])  2. Abnormal uterine bleeding (AUB) Rx: - US PELVIC COMPLETE WITH TRANSVAGINAL; Future  3. Primary dysmenorrhea Rx: - ibuprofen (ADVIL,MOTRIN) 800 MG tablet; Take 1 tablet (800 mg total) by mouth every 8 (eight) hours as needed.  Dispense: 30 tablet; Refill: 5  4. Screening for STD (sexually transmitted disease) Rx: - HepB+HepC+HIV Panel - RPR - HIV antibody (with reflex)  5. Obesity due to excess calories without serious comorbidity, unspecified classification Rx: - phentermine 37.5 MG capsule; Take 1 capsule (37.5 mg total) by mouth every morning.  Dispense: 30 capsule; Refill: 2 - program of caloric reduction, exercise and behavioral modification also recommended  6. Iron  deficiency anemia secondary to inadequate dietary iron intake Rx; - prenatal vitamin w/FE, FA (PRENATAL 1 + 1) 27-1 MG TABS tablet; Take 1 tablet by mouth daily before breakfast.  Dispense: 30 each; Refill: 11  7. Need for immunization against influenza Rx: - Flu Vaccine QUAD 36+ mos IM    Plan:    Education reviewed: calcium supplements, depression evaluation, low fat, low cholesterol diet, safe sex/STD prevention, self breast exams and weight bearing exercise. Follow up in: 2 weeks.  Consult with Dr. Earlene Plater for management options for AUB and discussion of U/S results.  Meds ordered this encounter  Medications  . phentermine 37.5 MG capsule    Sig: Take 1 capsule (37.5 mg total) by mouth every morning.    Dispense:  30 capsule    Refill:  2  . ibuprofen (ADVIL,MOTRIN) 800 MG tablet    Sig: Take 1 tablet (800 mg total) by mouth every 8 (eight) hours as needed.    Dispense:  30 tablet    Refill:  5  . prenatal vitamin w/FE, FA (  PRENATAL 1 + 1) 27-1 MG TABS tablet    Sig: Take 1 tablet by mouth daily before breakfast.    Dispense:  30 each    Refill:  11   Orders Placed This Encounter  Procedures  . US PELVIC COMPLETE WITH TRANSVAGINAL    Standing Status:   Future    Standing Expiration Date:   07/23/2019    Order Specific Question:   Reason for Exam (SYMPTOM  OR DIAGNOSIS REQUIRED)    Answer:   AUB    Order Specific Question:   Preferred imaging location?    Answer:   [facility]  . Flu Vaccine QUAD 36+ mos IM  . HepB+HepC+HIV Panel  . RPR  . HIV antibody (with reflex)    Brock Bad MD 05-22-2018

## 2018-05-22 NOTE — Patient Instructions (Signed)

## 2018-05-22 NOTE — Addendum Note (Signed)
Addended by: Areta Haber B on: 05/22/2018 03:33 PM   Modules accepted: Orders

## 2018-05-23 ENCOUNTER — Other Ambulatory Visit: Payer: Self-pay | Admitting: Obstetrics

## 2018-05-23 DIAGNOSIS — B373 Candidiasis of vulva and vagina: Secondary | ICD-10-CM

## 2018-05-23 DIAGNOSIS — B3731 Acute candidiasis of vulva and vagina: Secondary | ICD-10-CM

## 2018-05-23 LAB — CERVICOVAGINAL ANCILLARY ONLY
BACTERIAL VAGINITIS: NEGATIVE
CANDIDA VAGINITIS: POSITIVE — AB
Chlamydia: NEGATIVE
Neisseria Gonorrhea: NEGATIVE
TRICH (WINDOWPATH): NEGATIVE

## 2018-05-23 LAB — HEPB+HEPC+HIV PANEL
HEP B C IGM: NEGATIVE
HIV SCREEN 4TH GENERATION: NONREACTIVE
Hep B Core Total Ab: NEGATIVE
Hep B E Ab: NEGATIVE
Hep B E Ag: NEGATIVE
Hep B Surface Ab, Qual: NONREACTIVE
Hepatitis B Surface Ag: NEGATIVE

## 2018-05-23 LAB — RPR: RPR Ser Ql: NONREACTIVE

## 2018-05-23 MED ORDER — FLUCONAZOLE 150 MG PO TABS: 150.0000 mg | ORAL_TABLET | Freq: Once | ORAL | 0 refills | Status: AC

## 2018-05-24 LAB — CYTOLOGY - PAP: HPV: NOT DETECTED

## 2018-05-29 ENCOUNTER — Ambulatory Visit (HOSPITAL_COMMUNITY): Payer: 59

## 2018-05-30 ENCOUNTER — Ambulatory Visit (HOSPITAL_COMMUNITY): Payer: 59

## 2018-06-06 ENCOUNTER — Ambulatory Visit: Payer: 59 | Admitting: Obstetrics and Gynecology

## 2018-06-12 ENCOUNTER — Other Ambulatory Visit: Payer: 59 | Admitting: Obstetrics

## 2018-06-25 ENCOUNTER — Other Ambulatory Visit: Payer: 59 | Admitting: Obstetrics

## 2018-06-27 ENCOUNTER — Other Ambulatory Visit (HOSPITAL_COMMUNITY)
Admission: RE | Admit: 2018-06-27 | Discharge: 2018-06-27 | Disposition: A | Discharge status: Home / Self Care | Payer: 59 | Source: Ambulatory Visit | Attending: Obstetrics | Admitting: Obstetrics

## 2018-06-27 ENCOUNTER — Other Ambulatory Visit: Payer: Self-pay

## 2018-06-27 ENCOUNTER — Encounter: Payer: Self-pay | Admitting: Obstetrics

## 2018-06-27 ENCOUNTER — Ambulatory Visit (INDEPENDENT_AMBULATORY_CARE_PROVIDER_SITE_OTHER): Payer: 59 | Admitting: Obstetrics

## 2018-06-27 VITALS — BP 134/84 | HR 105 | Wt 268.0 lb

## 2018-06-27 DIAGNOSIS — N939 Abnormal uterine and vaginal bleeding, unspecified: Secondary | ICD-10-CM | POA: Diagnosis not present

## 2018-06-27 DIAGNOSIS — R87618 Other abnormal cytological findings on specimens from cervix uteri: Secondary | ICD-10-CM | POA: Insufficient documentation

## 2018-06-27 DIAGNOSIS — R87619 Unspecified abnormal cytological findings in specimens from cervix uteri: Secondary | ICD-10-CM

## 2018-06-27 DIAGNOSIS — Z3202 Encounter for pregnancy test, result negative: Secondary | ICD-10-CM | POA: Diagnosis not present

## 2018-06-27 LAB — POCT URINE PREGNANCY: PREG TEST UR: NEGATIVE

## 2018-06-27 NOTE — Progress Notes (Signed)
Endometrial Biopsy Procedure Note  Pre-operative Diagnosis: Atypical Glandular Cells on pap smeay                                              AUB  Post-operative Diagnosis: same  Indications: abnormal uterine bleeding  Procedure Details   Urine pregnancy test was done in office and result was negative.  The risks (including infection, bleeding, pain, and uterine perforation) and benefits of the procedure were explained to the patient and Written informed consent was obtained.    The patient was placed in the dorsal lithotomy position.  Bimanual exam showed the uterus to be in the neutral position.  A Graves' speculum inserted in the vagina, and the cervix prepped with povidone iodine.  Endocervical curettage with a Kevorkian curette was performed.   A sharp tenaculum was applied to the anterior lip of the cervix for stabilization.  A sterile uterine sound was used to sound the uterus to a depth of 8cm.  A Pipelle endometrial aspirator was used to sample the endometrium.  Sample was sent for pathologic examination.  Condition: Stable  Complications: None  Plan:  The patient was advised to call for any fever or for prolonged or severe pain or bleeding. She was advised to use NSAID as needed for mild to moderate pain. She was advised to avoid vaginal intercourse for 48 hours or until the bleeding has completely stopped.  Attending Physician Documentation: I was present for or participated in the entire procedure, including opening and closing.   Brock BadHARLES A. HARPER MD 06-27-2018

## 2018-06-27 NOTE — Progress Notes (Signed)
Pt presents for endometrial biopsy.   UPT : NEGATIVE

## 2018-06-27 NOTE — Addendum Note (Signed)
Addended by: Coral CeoHARPER, CHARLES A on: 06/27/2018 04:46 PM   Modules accepted: Orders

## 2018-07-03 ENCOUNTER — Telehealth: Payer: Self-pay

## 2018-07-03 NOTE — Telephone Encounter (Signed)
Returned call and answered questions about spotting.

## 2018-07-12 ENCOUNTER — Ambulatory Visit (HOSPITAL_COMMUNITY): Payer: 59

## 2018-07-19 ENCOUNTER — Telehealth: Payer: Self-pay

## 2018-07-19 DIAGNOSIS — B379 Candidiasis, unspecified: Secondary | ICD-10-CM

## 2018-07-19 MED ORDER — FLUCONAZOLE 150 MG PO TABS
150.0000 mg | ORAL_TABLET | Freq: Once | ORAL | 0 refills | Status: AC
Start: 2018-07-19 — End: 2018-07-19

## 2018-07-19 NOTE — Telephone Encounter (Signed)
Pt called requesting a diflucan for a yeast infection. She states that she just completed an antibiotic for her wisdom teeth, and has since developed sx of yeast infection. Per protocol rx sent to pharmacy.

## 2019-02-07 ENCOUNTER — Other Ambulatory Visit: Payer: Self-pay

## 2019-02-07 ENCOUNTER — Encounter: Payer: Self-pay | Admitting: Obstetrics

## 2019-02-07 ENCOUNTER — Ambulatory Visit (INDEPENDENT_AMBULATORY_CARE_PROVIDER_SITE_OTHER): Payer: 59 | Admitting: Obstetrics

## 2019-02-07 ENCOUNTER — Ambulatory Visit: Payer: 59 | Admitting: Obstetrics

## 2019-02-07 VITALS — BP 122/78 | HR 128 | Temp 97.7°F | Wt 304.0 lb

## 2019-02-07 DIAGNOSIS — N898 Other specified noninflammatory disorders of vagina: Secondary | ICD-10-CM | POA: Diagnosis not present

## 2019-02-07 DIAGNOSIS — R102 Pelvic and perineal pain: Secondary | ICD-10-CM | POA: Diagnosis not present

## 2019-02-07 DIAGNOSIS — B9689 Other specified bacterial agents as the cause of diseases classified elsewhere: Secondary | ICD-10-CM

## 2019-02-07 DIAGNOSIS — Z113 Encounter for screening for infections with a predominantly sexual mode of transmission: Secondary | ICD-10-CM

## 2019-02-07 DIAGNOSIS — N760 Acute vaginitis: Secondary | ICD-10-CM

## 2019-02-07 DIAGNOSIS — E6609 Other obesity due to excess calories: Secondary | ICD-10-CM | POA: Diagnosis not present

## 2019-02-07 NOTE — Progress Notes (Signed)
Pt c/o LLQ abdominal pain x 1 wk. Pt c/o increased vaginal discharge, denies odor.

## 2019-02-07 NOTE — Progress Notes (Signed)
I connected with Brooke Chan on 02/07/19 at 10:15 AM EDT by telephone and verified that I am speaking with the correct person using two identifiers.   Pt c/o intermittent cramping LLQ abdominal pain x 1 week. Increase vaginal discharge with itching no odor.

## 2019-02-07 NOTE — Progress Notes (Signed)
Patient ID: Brooke Chan, female   DOB: 12-Oct-1980, 38 y.o.   MRN: [id number]  Chief Complaint  Patient presents with  . Abdominal Pain    HPI Brooke Chan is a 38 y.o. female.  Pelvic pain and increased vaginal discharge for 1 week.  Denies fever/chills, dysuria or abnormal vaginal bleeding. HPI  Past Medical History:  Diagnosis Date  . Anemia   . GERD (gastroesophageal reflux disease)   . Headache(784.0)    history migraine  . Obese     Past Surgical History:  Procedure Laterality Date  . CESAREAN SECTION    . CESAREAN SECTION WITH BILATERAL TUBAL LIGATION Bilateral 07/12/2013   Procedure: REPEAT CESAREAN SECTION WITH BILATERAL TUBAL LIGATION;  Surgeon: Shelly Bombard, MD;  Location: [facility];  Service: Obstetrics;  Laterality: Bilateral;    Family History  Problem Relation Age of Onset  . Colitis Mother   . Seizures Mother   . Congestive Heart Failure Mother   . Hyperlipidemia Mother   . Hypertension Father   . Breast cancer Maternal Aunt   . Heart disease Maternal Grandfather   . Asthma Maternal Grandmother   . Heart attack Maternal Grandmother   . Cancer Other   . Kidney disease Other   . Brain cancer Other   . Prostate cancer Other     Social History Social History   Tobacco Use  . Smoking status: Never Smoker  . Smokeless tobacco: Never Used  Substance Use Topics  . Alcohol use: No    Alcohol/week: 0.0 standard drinks  . Drug use: No    Allergies  Allergen Reactions  . Fish Allergy Hives  . Shellfish Allergy Hives  . Penicillins Hives    Current Outpatient Medications  Medication Sig Dispense Refill  . ibuprofen (ADVIL,MOTRIN) 800 MG tablet Take 1 tablet (800 mg total) by mouth every 8 (eight) hours as needed. 30 tablet 5  . Multiple Vitamin (MULTIVITAMIN WITH MINERALS) TABS tablet Take 1 tablet by mouth daily.    . phentermine 37.5 MG capsule Take 1 capsule (37.5 mg total) by mouth every morning. (Patient not taking: Reported on  02/07/2019) 30 capsule 2  . prenatal vitamin w/FE, FA (PRENATAL 1 + 1) 27-1 MG TABS tablet Take 1 tablet by mouth daily before breakfast. (Patient not taking: Reported on 02/07/2019) 30 each 11   No current facility-administered medications for this visit.     Review of Systems Review of Systems Constitutional: negative for fatigue and weight loss Respiratory: negative for cough and wheezing Cardiovascular: negative for chest pain, fatigue and palpitations Gastrointestinal: negative for abdominal pain and change in bowel habits Genitourinary:positive for pelvic pain and vaginal discharge Integument/breast: negative for nipple discharge Musculoskeletal:negative for myalgias Neurological: negative for gait problems and tremors Behavioral/Psych: negative for abusive relationship, depression Endocrine: negative for temperature intolerance      Blood pressure 122/78, pulse (!) 128, temperature 97.7 F (36.5 C), weight (!) 304 lb (137.9 kg), last menstrual period 01/23/2019.  Physical Exam Physical Exam General:   alert  Skin:   no rash or abnormalities  Lungs:   clear to auscultation bilaterally  Heart:   regular rate and rhythm, S1, S2 normal, no murmur, click, rub or gallop  Breasts:   normal without suspicious masses, skin or nipple changes or axillary nodes  Abdomen:  normal findings: no organomegaly, soft, non-tender and no hernia  Pelvis:  External genitalia: normal general appearance Urinary system: urethral meatus normal and bladder without fullness, nontender Vaginal: normal  without tenderness, induration or masses Cervix: normal appearance Adnexa: normal bimanual exam Uterus: anteverted and non-tender, normal size    50% of 15 min visit spent on counseling and coordination of care.   Data Reviewed Labs  Assessment     1. Pelvic pain Rx: - US PELVIC COMPLETE WITH TRANSVAGINAL; Future  2. Vaginal discharge Rx: - Cervicovaginal ancillary only  3. Obesity due to  excess calories without serious comorbidity, unspecified classification - program of caloric reduction, exercise and behavioral modification recommended   Plan    Follow up after ultrasound  Orders Placed This Encounter  Procedures  . US PELVIC COMPLETE WITH TRANSVAGINAL    [address] [employer]. [employer] No needs [facility] w [place] No to all Covid Questions & Travel / Pt aware of Mask / Come Alone     Standing Status:   Future    Standing Expiration Date:   04/09/2020    Order Specific Question:   Reason for Exam (SYMPTOM  OR DIAGNOSIS REQUIRED)    Answer:   Pelvic pain    Order Specific Question:   Preferred imaging location?    Answer:   [facility]   No orders of the defined types were placed in this encounter.    Brock BadHARLES A.  MD 02-07-2019

## 2019-02-08 ENCOUNTER — Telehealth: Payer: Self-pay | Admitting: *Deleted

## 2019-02-08 ENCOUNTER — Ambulatory Visit
Admission: RE | Admit: 2019-02-08 | Discharge: 2019-02-08 | Disposition: A | Discharge status: Home / Self Care | Payer: 59 | Source: Ambulatory Visit | Attending: Obstetrics | Admitting: Obstetrics

## 2019-02-08 DIAGNOSIS — N8302 Follicular cyst of left ovary: Secondary | ICD-10-CM | POA: Diagnosis not present

## 2019-02-08 DIAGNOSIS — R102 Pelvic and perineal pain: Secondary | ICD-10-CM

## 2019-02-08 NOTE — Telephone Encounter (Signed)
Pt called to office for results from yesterday visit.  Pt made aware that results are still pending.  Pt states after her exam yesterday she started to have cramping as if her period is starting.  Pt states she has been bilateral pelvic pain.  Pt would like to know what to do for pain.  Pt advised she may take Ibuprofen 800mg  as needed.  Pt made aware that provider may not prescribe narcotic meds. Pt states understanding and would like to know if there is anything else she may do or take for pain.   Pt made aware message to be sent to provider for recommendation.  Please advise

## 2019-02-09 NOTE — Patient Instructions (Signed)
Appointment moved to the afternoon for in person visit.  Shelly Bombard MD

## 2019-02-11 LAB — CERVICOVAGINAL ANCILLARY ONLY
Bacterial vaginitis: POSITIVE — AB
Candida vaginitis: NEGATIVE
Chlamydia: NEGATIVE
Neisseria Gonorrhea: NEGATIVE
Trichomonas: NEGATIVE

## 2019-02-12 ENCOUNTER — Other Ambulatory Visit: Payer: Self-pay | Admitting: Obstetrics

## 2019-02-12 ENCOUNTER — Telehealth: Payer: Self-pay

## 2019-02-12 DIAGNOSIS — N760 Acute vaginitis: Secondary | ICD-10-CM

## 2019-02-12 DIAGNOSIS — B9689 Other specified bacterial agents as the cause of diseases classified elsewhere: Secondary | ICD-10-CM

## 2019-02-12 DIAGNOSIS — A599 Trichomoniasis, unspecified: Secondary | ICD-10-CM

## 2019-02-12 MED ORDER — TINIDAZOLE 500 MG PO TABS: 2.0000 g | ORAL_TABLET | Freq: Every day | ORAL | 0 refills | Status: DC

## 2019-02-12 NOTE — Telephone Encounter (Signed)
Advised of results and rx sent 

## 2019-02-15 ENCOUNTER — Ambulatory Visit (INDEPENDENT_AMBULATORY_CARE_PROVIDER_SITE_OTHER): Payer: 59 | Admitting: Obstetrics

## 2019-02-15 ENCOUNTER — Encounter: Payer: Self-pay | Admitting: Obstetrics

## 2019-02-15 DIAGNOSIS — E6609 Other obesity due to excess calories: Secondary | ICD-10-CM

## 2019-02-15 DIAGNOSIS — R102 Pelvic and perineal pain: Secondary | ICD-10-CM

## 2019-02-15 NOTE — Progress Notes (Signed)
TELEHEALTH GYNECOLOGY VIRTUAL VIDEO VISIT ENCOUNTER NOTE  Provider location: [facility] [place]   I connected with Brooke Chan on 02/15/19 at 10:30 AM EDT by WebEx Gyn MyChart Video Encounter at home and verified that I am speaking with the correct person using two identifiers.   I discussed the limitations, risks, security and privacy concerns of performing an evaluation and management service virtually and the availability of in person appointments. I also discussed with the patient that there may be a patient responsible charge related to this service. The patient expressed understanding and agreed to proceed.   History:  Brooke Chan is a 38 y.o. G23P2002 female being evaluated today for pelvic pain.. She denies any abnormal vaginal discharge, bleeding, or other concerns.  Pain much improved.     Past Medical History:  Diagnosis Date   Anemia    GERD (gastroesophageal reflux disease)    Headache(784.0)    history migraine   Obese    Past Surgical History:  Procedure Laterality Date   CESAREAN SECTION     CESAREAN SECTION WITH BILATERAL TUBAL LIGATION Bilateral 07/12/2013   Procedure: REPEAT CESAREAN SECTION WITH BILATERAL TUBAL LIGATION;  Surgeon: Shelly Bombard, MD;  Location: [facility];  Service: Obstetrics;  Laterality: Bilateral;   The following portions of the patient's history were reviewed and updated as appropriate: allergies, current medications, past family history, past medical history, past social history, past surgical history and problem list.     Review of Systems:  Pertinent items noted in HPI and remainder of comprehensive ROS otherwise negative.  Physical Exam:   General:  Alert, oriented and cooperative. Patient appears to be in no acute distress.  Mental Status: Normal mood and affect. Normal behavior. Normal judgment and thought content.   Respiratory: Normal respiratory effort, no problems with respiration noted    Rest of physical exam deferred due to type of encounter  Labs and Imaging Results for orders placed or performed in visit on 02/07/19 (from the past 336 hour(s))  Cervicovaginal ancillary only   Collection Time: 02/07/19 12:00 AM  Result Value Ref Range   Bacterial vaginitis **POSITIVE for Gardnerella vaginalis** (A)    Candida vaginitis Negative for Candida species    Chlamydia Negative    Neisseria gonorrhea Negative    Trichomonas Negative    US Pelvic Complete With Transvaginal  Result Date: 02/08/2019 CLINICAL DATA:  38 year old presenting with a 1 week history of pelvic pain. LMP 01/23/2019. EXAM: TRANSABDOMINAL AND TRANSVAGINAL ULTRASOUND OF PELVIS TECHNIQUE: Both transabdominal and transvaginal ultrasound examinations of the pelvis were performed. Transabdominal technique was performed for global imaging of the pelvis including uterus, ovaries, adnexal regions, and pelvic cul-de-sac. It was necessary to proceed with endovaginal exam following the transabdominal exam to visualize the endometrium and ovaries as the bladder was incompletely distended. COMPARISON:  10/16/2015. FINDINGS: Uterus Measurements: Approximately 9.2 x 5.0 x 6.6 cm = volume: 158 mL. Heterogeneous myometrial echotexture without discrete focal fibroids. Endometrium Thickness: Approximately 10 mm. Normal appearance without evidence of endometrial fluid or mass. Right ovary Measurements: Approximately 2.0 x 1.2 x 1.8 cm = volume: 2.3 mL. Small follicular cysts. No dominant cyst or solid mass. Normal color Doppler flow within the ovary. Left ovary Measurements: Approximately 2.7 x 2.3 x 2.2 cm = volume: 7.1 mL. Benign cyst measuring approximately 1.7 x 1.5 x 1.2 cm. No dominant cyst or solid mass. Normal color Doppler flow within the ovary. Other findings No abnormal free fluid.  IMPRESSION: 1. Heterogeneous uterine myometrium without discrete fibroids. 2. Normal appearing ovaries with a small follicular cyst in the LEFT  ovary. No dominant ovarian cyst or solid mass. Electronically Signed   By: Hulan Saashomas  Lawrence M.D.   On: 02/08/2019 20:49       Assessment and Plan:     There are no diagnoses linked to this encounter.     1. Pelvic pain  2. Obesity due to excess calories without serious comorbidity, unspecified classification  I discussed the assessment and treatment plan with the patient. The patient was provided an opportunity to ask questions and all were answered. The patient agreed with the plan and demonstrated an understanding of the instructions.   The patient was advised to call back or seek an in-person evaluation/go to the ED if the symptoms worsen or if the condition fails to improve as anticipated.  I provided 10 minutes of face-to-face time during this encounter.   Coral Ceoharles Bertil Brickey, MD [facility] 02-15-2019

## 2019-04-03 ENCOUNTER — Other Ambulatory Visit: Payer: Self-pay | Admitting: Obstetrics

## 2019-04-03 DIAGNOSIS — N944 Primary dysmenorrhea: Secondary | ICD-10-CM

## 2019-05-17 ENCOUNTER — Other Ambulatory Visit: Payer: Self-pay

## 2019-05-17 MED ORDER — FLUCONAZOLE 150 MG PO TABS: 150.0000 mg | ORAL_TABLET | Freq: Once | ORAL | 0 refills | Status: DC

## 2019-05-17 NOTE — Telephone Encounter (Signed)
Returned call to patient, she is requesting Rx for Yeast. Rx sent to Pharmacy per Protocol.

## 2019-06-04 ENCOUNTER — Other Ambulatory Visit: Payer: Self-pay

## 2019-06-04 DIAGNOSIS — Z20822 Contact with and (suspected) exposure to covid-19: Secondary | ICD-10-CM

## 2019-06-05 LAB — NOVEL CORONAVIRUS, NAA: SARS-CoV-2, NAA: NOT DETECTED

## 2019-08-09 DIAGNOSIS — M25532 Pain in left wrist: Secondary | ICD-10-CM | POA: Diagnosis not present

## 2019-08-09 DIAGNOSIS — Z91018 Allergy to other foods: Secondary | ICD-10-CM | POA: Diagnosis not present

## 2019-08-09 DIAGNOSIS — M25539 Pain in unspecified wrist: Secondary | ICD-10-CM | POA: Diagnosis not present

## 2019-08-09 DIAGNOSIS — R69 Illness, unspecified: Secondary | ICD-10-CM | POA: Diagnosis not present

## 2019-08-22 DIAGNOSIS — M654 Radial styloid tenosynovitis [de Quervain]: Secondary | ICD-10-CM | POA: Diagnosis not present

## 2019-08-22 DIAGNOSIS — M778 Other enthesopathies, not elsewhere classified: Secondary | ICD-10-CM | POA: Diagnosis not present

## 2019-09-09 DIAGNOSIS — R69 Illness, unspecified: Secondary | ICD-10-CM | POA: Diagnosis not present

## 2019-09-17 DIAGNOSIS — R69 Illness, unspecified: Secondary | ICD-10-CM | POA: Diagnosis not present

## 2019-09-24 DIAGNOSIS — R69 Illness, unspecified: Secondary | ICD-10-CM | POA: Diagnosis not present

## 2019-09-25 DIAGNOSIS — R69 Illness, unspecified: Secondary | ICD-10-CM | POA: Diagnosis not present

## 2019-09-26 DIAGNOSIS — K122 Cellulitis and abscess of mouth: Secondary | ICD-10-CM | POA: Diagnosis not present

## 2019-09-30 DIAGNOSIS — R69 Illness, unspecified: Secondary | ICD-10-CM | POA: Diagnosis not present

## 2019-10-10 DIAGNOSIS — R69 Illness, unspecified: Secondary | ICD-10-CM | POA: Diagnosis not present

## 2019-10-14 DIAGNOSIS — R69 Illness, unspecified: Secondary | ICD-10-CM | POA: Diagnosis not present

## 2019-10-21 ENCOUNTER — Other Ambulatory Visit: Payer: Self-pay

## 2019-10-21 MED ORDER — FLUCONAZOLE 150 MG PO TABS: 150.0000 mg | ORAL_TABLET | Freq: Once | ORAL | 0 refills | Status: AC

## 2019-10-21 NOTE — Progress Notes (Signed)
Pt called with symptoms of a yeast infection, diflucan sent per protocol. Pt instructed to call back if symptoms do not improve, pt voices understanding.

## 2019-10-22 ENCOUNTER — Ambulatory Visit: Payer: 59 | Admitting: Obstetrics

## 2019-10-23 DIAGNOSIS — F329 Major depressive disorder, single episode, unspecified: Secondary | ICD-10-CM | POA: Diagnosis not present

## 2019-10-23 DIAGNOSIS — R69 Illness, unspecified: Secondary | ICD-10-CM | POA: Diagnosis not present

## 2019-10-23 DIAGNOSIS — B373 Candidiasis of vulva and vagina: Secondary | ICD-10-CM | POA: Diagnosis not present

## 2019-10-26 DIAGNOSIS — R52 Pain, unspecified: Secondary | ICD-10-CM | POA: Diagnosis not present

## 2019-10-27 ENCOUNTER — Encounter (HOSPITAL_COMMUNITY): Payer: Self-pay

## 2019-10-27 ENCOUNTER — Emergency Department (HOSPITAL_COMMUNITY)
Admission: EM | Admit: 2019-10-27 | Discharge: 2019-10-27 | Disposition: A | Discharge status: Home / Self Care | Payer: No Typology Code available for payment source | Attending: Emergency Medicine | Admitting: Emergency Medicine

## 2019-10-27 ENCOUNTER — Other Ambulatory Visit: Payer: Self-pay

## 2019-10-27 ENCOUNTER — Emergency Department (HOSPITAL_COMMUNITY): Payer: No Typology Code available for payment source

## 2019-10-27 DIAGNOSIS — S20212A Contusion of left front wall of thorax, initial encounter: Secondary | ICD-10-CM

## 2019-10-27 DIAGNOSIS — M79622 Pain in left upper arm: Secondary | ICD-10-CM | POA: Diagnosis not present

## 2019-10-27 DIAGNOSIS — M25512 Pain in left shoulder: Secondary | ICD-10-CM

## 2019-10-27 DIAGNOSIS — Y9289 Other specified places as the place of occurrence of the external cause: Secondary | ICD-10-CM | POA: Diagnosis not present

## 2019-10-27 DIAGNOSIS — Y999 Unspecified external cause status: Secondary | ICD-10-CM | POA: Diagnosis not present

## 2019-10-27 DIAGNOSIS — S161XXA Strain of muscle, fascia and tendon at neck level, initial encounter: Secondary | ICD-10-CM | POA: Diagnosis not present

## 2019-10-27 DIAGNOSIS — M542 Cervicalgia: Secondary | ICD-10-CM | POA: Diagnosis not present

## 2019-10-27 DIAGNOSIS — S299XXA Unspecified injury of thorax, initial encounter: Secondary | ICD-10-CM | POA: Diagnosis not present

## 2019-10-27 DIAGNOSIS — Y9389 Activity, other specified: Secondary | ICD-10-CM | POA: Diagnosis not present

## 2019-10-27 DIAGNOSIS — M79652 Pain in left thigh: Secondary | ICD-10-CM | POA: Diagnosis not present

## 2019-10-27 DIAGNOSIS — Z79899 Other long term (current) drug therapy: Secondary | ICD-10-CM | POA: Insufficient documentation

## 2019-10-27 DIAGNOSIS — S199XXA Unspecified injury of neck, initial encounter: Secondary | ICD-10-CM | POA: Diagnosis present

## 2019-10-27 HISTORY — DX: Depression, unspecified: F32.A

## 2019-10-27 MED ORDER — IBUPROFEN 400 MG PO TABS
400.0000 mg | ORAL_TABLET | Freq: Once | ORAL | Status: AC
  Administered 2019-10-27: 400 mg via ORAL
  Filled 2019-10-27: qty 1

## 2019-10-27 MED ORDER — HYDROCODONE-ACETAMINOPHEN 5-325 MG PO TABS
1.0000 | ORAL_TABLET | Freq: Once | ORAL | Status: AC
  Administered 2019-10-27: 1 via ORAL
  Filled 2019-10-27: qty 1

## 2019-10-27 MED ORDER — NAPROXEN 500 MG PO TABS: 500.0000 mg | ORAL_TABLET | Freq: Two times a day (BID) | ORAL | 0 refills | Status: DC

## 2019-10-27 MED ORDER — HYDROCODONE-ACETAMINOPHEN 5-325 MG PO TABS: 1.0000 | ORAL_TABLET | ORAL | 0 refills | Status: DC | PRN

## 2019-10-27 NOTE — ED Triage Notes (Signed)
Pt complains of left shoulder, hip, rib, and neck pain. No c-collar because patient could not tolerate. 100 mcg Fentanyl given IV via EMS PTA.

## 2019-10-27 NOTE — Discharge Instructions (Signed)
Apply ice to sore areas.  Ice should be applied for 20-30 minutes at a time, 3-4 times a day.
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
## 2019-10-27 NOTE — ED Notes (Signed)
Mother, Francena Hanly, updated on the patient.

## 2019-10-27 NOTE — ED Notes (Signed)
[name],mother, [phone] would like an update when available

## 2019-10-27 NOTE — ED Notes (Addendum)
Patient transported to X-Ray 
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
## 2019-10-28 ENCOUNTER — Ambulatory Visit: Payer: 59 | Admitting: Obstetrics

## 2019-10-31 DIAGNOSIS — R69 Illness, unspecified: Secondary | ICD-10-CM | POA: Diagnosis not present

## 2019-11-15 DIAGNOSIS — R69 Illness, unspecified: Secondary | ICD-10-CM | POA: Diagnosis not present

## 2019-11-20 DIAGNOSIS — S20212A Contusion of left front wall of thorax, initial encounter: Secondary | ICD-10-CM | POA: Diagnosis not present

## 2019-11-20 DIAGNOSIS — R69 Illness, unspecified: Secondary | ICD-10-CM | POA: Diagnosis not present

## 2019-11-20 DIAGNOSIS — F411 Generalized anxiety disorder: Secondary | ICD-10-CM | POA: Diagnosis not present

## 2019-11-20 DIAGNOSIS — J302 Other seasonal allergic rhinitis: Secondary | ICD-10-CM | POA: Diagnosis not present

## 2019-12-06 DIAGNOSIS — R69 Illness, unspecified: Secondary | ICD-10-CM | POA: Diagnosis not present

## 2019-12-17 ENCOUNTER — Other Ambulatory Visit: Payer: Self-pay

## 2019-12-17 ENCOUNTER — Other Ambulatory Visit (HOSPITAL_COMMUNITY)
Admission: RE | Admit: 2019-12-17 | Discharge: 2019-12-17 | Disposition: A | Discharge status: Home / Self Care | Payer: 59 | Source: Ambulatory Visit | Attending: Obstetrics | Admitting: Obstetrics

## 2019-12-17 ENCOUNTER — Ambulatory Visit (INDEPENDENT_AMBULATORY_CARE_PROVIDER_SITE_OTHER): Payer: 59 | Admitting: Obstetrics

## 2019-12-17 ENCOUNTER — Encounter: Payer: Self-pay | Admitting: Obstetrics

## 2019-12-17 VITALS — BP 124/83 | HR 106 | Ht 66.0 in | Wt 290.9 lb

## 2019-12-17 DIAGNOSIS — N944 Primary dysmenorrhea: Secondary | ICD-10-CM | POA: Diagnosis not present

## 2019-12-17 DIAGNOSIS — Z1272 Encounter for screening for malignant neoplasm of vagina: Secondary | ICD-10-CM

## 2019-12-17 DIAGNOSIS — N898 Other specified noninflammatory disorders of vagina: Secondary | ICD-10-CM | POA: Insufficient documentation

## 2019-12-17 DIAGNOSIS — R87619 Unspecified abnormal cytological findings in specimens from cervix uteri: Secondary | ICD-10-CM

## 2019-12-17 DIAGNOSIS — Z01419 Encounter for gynecological examination (general) (routine) without abnormal findings: Secondary | ICD-10-CM | POA: Diagnosis not present

## 2019-12-17 DIAGNOSIS — Z803 Family history of malignant neoplasm of breast: Secondary | ICD-10-CM

## 2019-12-17 DIAGNOSIS — Z113 Encounter for screening for infections with a predominantly sexual mode of transmission: Secondary | ICD-10-CM

## 2019-12-17 DIAGNOSIS — R69 Illness, unspecified: Secondary | ICD-10-CM | POA: Diagnosis not present

## 2019-12-17 DIAGNOSIS — Z6841 Body Mass Index (BMI) 40.0 and over, adult: Secondary | ICD-10-CM | POA: Diagnosis not present

## 2019-12-17 DIAGNOSIS — F320 Major depressive disorder, single episode, mild: Secondary | ICD-10-CM

## 2019-12-17 MED ORDER — IBUPROFEN 800 MG PO TABS: 800.0000 mg | ORAL_TABLET | Freq: Three times a day (TID) | ORAL | 5 refills | Status: DC | PRN

## 2019-12-17 MED ORDER — ESCITALOPRAM OXALATE 10 MG PO TABS: 10.0000 mg | ORAL_TABLET | Freq: Every day | ORAL | 11 refills | Status: DC

## 2019-12-17 NOTE — Progress Notes (Signed)
Subjective:        Brooke Chan is a 39 y.o. female here for a routine exam.  Current complaints: None.    Personal health questionnaire:  Is patient Ashkenazi Jewish, have a family history of breast and/or ovarian cancer: no Is there a family history of uterine cancer diagnosed at age < 48, gastrointestinal cancer, urinary tract cancer, family member who is a Field seismologist syndrome-associated carrier: no Is the patient overweight and hypertensive, family history of diabetes, personal history of gestational diabetes, preeclampsia or PCOS: no Is patient over 38, have PCOS,  family history of premature CHD under age 84, diabetes, smoke, have hypertension or peripheral artery disease:  no At any time, has a partner hit, kicked or otherwise hurt or frightened you?: no Over the past 2 weeks, have you felt down, depressed or hopeless?: no Over the past 2 weeks, have you felt little interest or pleasure in doing things?:no   Gynecologic History Patient's last menstrual period was 11/23/2019. Contraception: tubal ligation Last Pap: 05-22-2018. Results were: AGC.  Colposcopy:  ECC-normal glandular and squamous cells.  Biopsies - no dysplasia or malignancy.   Endometrial Biopsy:  Proliferative endometrium.  No dysplasia or malignancy.  Last mammogram: n/a. Results were: n/a  Obstetric History OB History  Gravida Para Term Preterm AB Living  2 2 2  0 0 2  SAB TAB Ectopic Multiple Live Births  0 0 0 0 2    # Outcome Date GA Lbr Len/2nd Weight Sex Delivery Anes PTL Lv  2 Term 07/12/13 [redacted]w[redacted]d   F CS-LTranv Spinal  LIV  1 Term 11/22/00    M CS-LTranv Spinal  LIV    Past Medical History:  Diagnosis Date  . Anemia   . Depression   . GERD (gastroesophageal reflux disease)   . Headache(784.0)    history migraine  . Obese     Past Surgical History:  Procedure Laterality Date  . CESAREAN SECTION    . CESAREAN SECTION WITH BILATERAL TUBAL LIGATION Bilateral 07/12/2013   Procedure: REPEAT  CESAREAN SECTION WITH BILATERAL TUBAL LIGATION;  Surgeon: Shelly Bombard, MD;  Location: [facility];  Service: Obstetrics;  Laterality: Bilateral;     Current Outpatient Medications:  .  escitalopram (LEXAPRO) 10 MG tablet, Take 15 mg by mouth daily., Disp: , Rfl:  .  ibuprofen (ADVIL) 800 MG tablet, TAKE 1 TABLET BY MOUTH EVERY 8 HOURS AS NEEDED, Disp: 30 tablet, Rfl: 5 .  HYDROcodone-acetaminophen (NORCO) 5-325 MG tablet, Take 1 tablet by mouth every 4 (four) hours as needed for moderate pain. (Patient not taking: Reported on 12/17/2019), Disp: 10 tablet, Rfl: 0 .  Multiple Vitamin (MULTIVITAMIN WITH MINERALS) TABS tablet, Take 1 tablet by mouth daily., Disp: , Rfl:  .  naproxen (NAPROSYN) 500 MG tablet, Take 1 tablet (500 mg total) by mouth 2 (two) times daily. (Patient not taking: Reported on 12/17/2019), Disp: 30 tablet, Rfl: 0 .  tinidazole (TINDAMAX) 500 MG tablet, Take 4 tablets (2,000 mg total) by mouth daily with breakfast. (Patient not taking: Reported on 12/17/2019), Disp: 8 tablet, Rfl: 0 Allergies  Allergen Reactions  . Fish Allergy Hives  . Shellfish Allergy Hives  . Penicillins Hives    Social History   Tobacco Use  . Smoking status: Never Smoker  . Smokeless tobacco: Never Used  Substance Use Topics  . Alcohol use: No    Alcohol/week: 0.0 standard drinks    Family History  Problem Relation Age of Onset  . Colitis  Mother   . Seizures Mother   . Congestive Heart Failure Mother   . Hyperlipidemia Mother   . Hypertension Father   . Breast cancer Maternal Aunt   . Heart disease Maternal Grandfather   . Asthma Maternal Grandmother   . Heart attack Maternal Grandmother   . Cancer Other   . Kidney disease Other   . Brain cancer Other   . Prostate cancer Other       Review of Systems  Constitutional: negative for fatigue and weight loss Respiratory: negative for cough and wheezing Cardiovascular: negative for chest pain, fatigue and palpitations Gastrointestinal:  negative for abdominal pain and change in bowel habits Musculoskeletal:negative for myalgias Neurological: negative for gait problems and tremors Behavioral/Psych: negative for abusive relationship, depression Endocrine: negative for temperature intolerance    Genitourinary:negative for abnormal menstrual periods, genital lesions, hot flashes, sexual problems and vaginal discharge Integument/breast: negative for breast lump, breast tenderness, nipple discharge and skin lesion(s)    Objective:       Ht 5\' 6"  (1.676 m)   Wt 290 lb 14.4 oz (132 kg)   LMP 11/23/2019   BMI 46.95 kg/m  General:   alert  Skin:   no rash or abnormalities  Lungs:   clear to auscultation bilaterally  Heart:   regular rate and rhythm, S1, S2 normal, no murmur, click, rub or gallop  Breasts:   normal without suspicious masses, skin or nipple changes or axillary nodes  Abdomen:  normal findings: no organomegaly, soft, non-tender and no hernia  Pelvis:  External genitalia: normal general appearance Urinary system: urethral meatus normal and bladder without fullness, nontender Vaginal: normal without tenderness, induration or masses Cervix: normal appearance Adnexa: normal bimanual exam Uterus: anteverted and non-tender, normal size   Lab Review Urine pregnancy test Labs reviewed yes Radiologic studies reviewed no  50% of 20 min visit spent on counseling and coordination of care.   Assessment:     1. Encounter for routine gynecological examination with Papanicolaou smear of cervix Rx: - Cytology - PAP  2. Atypical glandular cells on cervical Pap smear - Colposcopy: normal squamous and glandular cells - Endometrial Biopsy: normal proliferative endometrium  3. Vaginal discharge Rx: - Cervicovaginal ancillary only( [place])  4. Screening for STD (sexually transmitted disease) Rx: - Hepatitis B surface antigen - Hepatitis C antibody - HIV Antibody (routine testing w rflx) - RPR  5. Class 3  severe obesity due to excess calories without serious comorbidity with body mass index (BMI) of 45.0 to 49.9 in adult [facility]) - program of caloric reduction, exercise and behavioral modification recommended  6. Primary dysmenorrhea Rx: - ibuprofen (ADVIL) 800 MG tablet; Take 1 tablet (800 mg total) by mouth every 8 (eight) hours as needed.  Dispense: 30 tablet; Refill: 5  7. Current mild episode of major depressive disorder, unspecified whether recurrent (HCC) Rx: - escitalopram (LEXAPRO) 10 MG tablet; Take 1 tablet (10 mg total) by mouth daily.  Dispense: 30 tablet; Refill: 11    Plan:    Education reviewed: calcium supplements, depression evaluation, low fat, low cholesterol diet, safe sex/STD prevention, self breast exams and weight bearing exercise. Follow up in: 1 year.   No orders of the defined types were placed in this encounter.  Orders Placed This Encounter  Procedures  . Hepatitis B surface antigen  . Hepatitis C antibody  . HIV Antibody (routine testing w rflx)  . RPR    [facility], MD 12/17/2019 1:38 PM

## 2019-12-17 NOTE — Progress Notes (Signed)
Pt presents for annual, pap and STD testing.  GAD = 0

## 2019-12-18 LAB — CERVICOVAGINAL ANCILLARY ONLY
Bacterial Vaginitis (gardnerella): POSITIVE — AB
Candida Glabrata: NEGATIVE
Candida Vaginitis: NEGATIVE
Chlamydia: NEGATIVE
Comment: NEGATIVE
Comment: NEGATIVE
Comment: NEGATIVE
Comment: NEGATIVE
Comment: NEGATIVE
Comment: NORMAL
Neisseria Gonorrhea: NEGATIVE
Trichomonas: NEGATIVE

## 2019-12-18 LAB — HEPATITIS C ANTIBODY: Hep C Virus Ab: <0.1 s/co ratio (ref 0.0–0.9)

## 2019-12-18 LAB — CYTOLOGY - PAP
Comment: NEGATIVE
Diagnosis: NEGATIVE
High risk HPV: NEGATIVE

## 2019-12-18 LAB — HEPATITIS B SURFACE ANTIGEN: Hepatitis B Surface Ag: NEGATIVE

## 2019-12-18 LAB — RPR: RPR Ser Ql: NONREACTIVE

## 2019-12-18 LAB — HIV ANTIBODY (ROUTINE TESTING W REFLEX): HIV Screen 4th Generation wRfx: NONREACTIVE

## 2019-12-19 ENCOUNTER — Other Ambulatory Visit: Payer: Self-pay | Admitting: Obstetrics

## 2019-12-19 DIAGNOSIS — B9689 Other specified bacterial agents as the cause of diseases classified elsewhere: Secondary | ICD-10-CM

## 2019-12-19 DIAGNOSIS — N760 Acute vaginitis: Secondary | ICD-10-CM

## 2019-12-19 DIAGNOSIS — A599 Trichomoniasis, unspecified: Secondary | ICD-10-CM

## 2019-12-19 MED ORDER — TINIDAZOLE 500 MG PO TABS: 2.0000 g | ORAL_TABLET | Freq: Every day | ORAL | 0 refills | Status: DC

## 2019-12-25 DIAGNOSIS — R69 Illness, unspecified: Secondary | ICD-10-CM | POA: Diagnosis not present

## 2020-01-14 DIAGNOSIS — R69 Illness, unspecified: Secondary | ICD-10-CM | POA: Diagnosis not present

## 2020-01-15 ENCOUNTER — Other Ambulatory Visit: Payer: Self-pay

## 2020-01-15 MED ORDER — FLUCONAZOLE 150 MG PO TABS: 150.0000 mg | ORAL_TABLET | Freq: Once | ORAL | 0 refills | Status: AC

## 2020-01-29 DIAGNOSIS — R69 Illness, unspecified: Secondary | ICD-10-CM | POA: Diagnosis not present

## 2020-02-12 DIAGNOSIS — R69 Illness, unspecified: Secondary | ICD-10-CM | POA: Diagnosis not present

## 2020-02-25 DIAGNOSIS — D509 Iron deficiency anemia, unspecified: Secondary | ICD-10-CM | POA: Diagnosis not present

## 2020-02-25 DIAGNOSIS — M549 Dorsalgia, unspecified: Secondary | ICD-10-CM | POA: Diagnosis not present

## 2020-02-25 DIAGNOSIS — R7303 Prediabetes: Secondary | ICD-10-CM | POA: Diagnosis not present

## 2020-02-25 DIAGNOSIS — N911 Secondary amenorrhea: Secondary | ICD-10-CM | POA: Diagnosis not present

## 2020-02-25 DIAGNOSIS — R5383 Other fatigue: Secondary | ICD-10-CM | POA: Diagnosis not present

## 2020-02-26 DIAGNOSIS — R69 Illness, unspecified: Secondary | ICD-10-CM | POA: Diagnosis not present

## 2020-03-24 ENCOUNTER — Other Ambulatory Visit: Payer: Self-pay | Admitting: *Deleted

## 2020-03-24 MED ORDER — FLUCONAZOLE 150 MG PO TABS: 150.0000 mg | ORAL_TABLET | Freq: Once | ORAL | 0 refills | Status: DC

## 2020-03-24 NOTE — Progress Notes (Signed)
Diflucan sent for yeast infection.

## 2020-07-10 DIAGNOSIS — F329 Major depressive disorder, single episode, unspecified: Secondary | ICD-10-CM | POA: Diagnosis not present

## 2020-07-10 DIAGNOSIS — D509 Iron deficiency anemia, unspecified: Secondary | ICD-10-CM | POA: Diagnosis not present

## 2020-07-10 DIAGNOSIS — E559 Vitamin D deficiency, unspecified: Secondary | ICD-10-CM | POA: Diagnosis not present

## 2020-07-10 DIAGNOSIS — R69 Illness, unspecified: Secondary | ICD-10-CM | POA: Diagnosis not present

## 2020-08-14 DIAGNOSIS — E559 Vitamin D deficiency, unspecified: Secondary | ICD-10-CM | POA: Diagnosis not present

## 2020-10-23 ENCOUNTER — Other Ambulatory Visit: Payer: Self-pay

## 2020-10-23 MED ORDER — FLUCONAZOLE 150 MG PO TABS: 150.0000 mg | ORAL_TABLET | Freq: Once | ORAL | 0 refills | Status: AC

## 2020-10-23 NOTE — Telephone Encounter (Signed)
Patient is requesting yeast infection medication be called into her local pharmacy. I will go ahead and send over.

## 2020-12-17 ENCOUNTER — Ambulatory Visit (INDEPENDENT_AMBULATORY_CARE_PROVIDER_SITE_OTHER): Payer: No Typology Code available for payment source | Admitting: Obstetrics

## 2020-12-17 ENCOUNTER — Other Ambulatory Visit (HOSPITAL_COMMUNITY)
Admission: RE | Admit: 2020-12-17 | Discharge: 2020-12-17 | Disposition: A | Discharge status: Home / Self Care | Payer: No Typology Code available for payment source | Source: Ambulatory Visit | Attending: Obstetrics | Admitting: Obstetrics

## 2020-12-17 ENCOUNTER — Encounter: Payer: Self-pay | Admitting: Obstetrics

## 2020-12-17 ENCOUNTER — Other Ambulatory Visit: Payer: Self-pay

## 2020-12-17 VITALS — BP 128/83 | HR 99 | Ht 66.0 in | Wt 299.9 lb

## 2020-12-17 DIAGNOSIS — N944 Primary dysmenorrhea: Secondary | ICD-10-CM | POA: Insufficient documentation

## 2020-12-17 DIAGNOSIS — N898 Other specified noninflammatory disorders of vagina: Secondary | ICD-10-CM

## 2020-12-17 DIAGNOSIS — Z113 Encounter for screening for infections with a predominantly sexual mode of transmission: Secondary | ICD-10-CM | POA: Diagnosis not present

## 2020-12-17 DIAGNOSIS — Z1239 Encounter for other screening for malignant neoplasm of breast: Secondary | ICD-10-CM

## 2020-12-17 DIAGNOSIS — Z01419 Encounter for gynecological examination (general) (routine) without abnormal findings: Secondary | ICD-10-CM

## 2020-12-17 DIAGNOSIS — R69 Illness, unspecified: Secondary | ICD-10-CM | POA: Diagnosis not present

## 2020-12-17 MED ORDER — IBUPROFEN 800 MG PO TABS: 800.0000 mg | ORAL_TABLET | Freq: Three times a day (TID) | ORAL | 5 refills | Status: DC | PRN

## 2020-12-17 NOTE — Progress Notes (Signed)
Pt presents for annual, pap, and all STD testing.   

## 2020-12-17 NOTE — Progress Notes (Signed)
Subjective:        Brooke Chan is a 40 y.o. female here for a routine exam.  Current complaints: Vaginal discharge.    Personal health questionnaire:  Is patient Ashkenazi Jewish, have a family history of breast and/or ovarian cancer: no Is there a family history of uterine cancer diagnosed at age < 79, gastrointestinal cancer, urinary tract cancer, family member who is a Personnel officer syndrome-associated carrier: no Is the patient overweight and hypertensive, family history of diabetes, personal history of gestational diabetes, preeclampsia or PCOS: no Is patient over 50, have PCOS,  family history of premature CHD under age 36, diabetes, smoke, have hypertension or peripheral artery disease:  no At any time, has a partner hit, kicked or otherwise hurt or frightened you?: no Over the past 2 weeks, have you felt down, depressed or hopeless?: no Over the past 2 weeks, have you felt little interest or pleasure in doing things?:no   Gynecologic History Patient's last menstrual period was 11/25/2020. Contraception: tubal ligation Last Pap: 12-17-2019. Results were: normal Last mammogram: n/a. Results were: n/a  Obstetric History OB History  Gravida Para Term Preterm AB Living  2 2 2  0 0 2  SAB IAB Ectopic Multiple Live Births  0 0 0 0 2    # Outcome Date GA Lbr Len/2nd Weight Sex Delivery Anes PTL Lv  2 Term 07/12/13 [redacted]w[redacted]d   F CS-LTranv Spinal  LIV  1 Term 11/22/00    M CS-LTranv Spinal  LIV    Past Medical History:  Diagnosis Date  . Anemia   . Depression   . GERD (gastroesophageal reflux disease)   . Headache(784.0)    history migraine  . Obese     Past Surgical History:  Procedure Laterality Date  . CESAREAN SECTION    . CESAREAN SECTION WITH BILATERAL TUBAL LIGATION Bilateral 07/12/2013   Procedure: REPEAT CESAREAN SECTION WITH BILATERAL TUBAL LIGATION;  Surgeon: 07/14/2013, MD;  Location: [facility];  Service: Obstetrics;  Laterality: Bilateral;     Current  Outpatient Medications:  .  ibuprofen (ADVIL) 800 MG tablet, Take 1 tablet (800 mg total) by mouth every 8 (eight) hours as needed., Disp: 30 tablet, Rfl: 5 .  Multiple Vitamin (MULTIVITAMIN WITH MINERALS) TABS tablet, Take 1 tablet by mouth daily., Disp: , Rfl:  .  escitalopram (LEXAPRO) 10 MG tablet, Take 1 tablet (10 mg total) by mouth daily. (Patient not taking: Reported on 12/17/2020), Disp: 30 tablet, Rfl: 11 .  HYDROcodone-acetaminophen (NORCO) 5-325 MG tablet, Take 1 tablet by mouth every 4 (four) hours as needed for moderate pain., Disp: 10 tablet, Rfl: 0 .  naproxen (NAPROSYN) 500 MG tablet, Take 1 tablet (500 mg total) by mouth 2 (two) times daily., Disp: 30 tablet, Rfl: 0 .  tinidazole (TINDAMAX) 500 MG tablet, Take 4 tablets (2,000 mg total) by mouth daily with breakfast., Disp: 8 tablet, Rfl: 0 Allergies  Allergen Reactions  . Fish Allergy Hives  . Shellfish Allergy Hives  . Penicillins Hives    Social History   Tobacco Use  . Smoking status: Never Smoker  . Smokeless tobacco: Never Used  Substance Use Topics  . Alcohol use: No    Alcohol/week: 0.0 standard drinks    Family History  Problem Relation Age of Onset  . Colitis Mother   . Seizures Mother   . Congestive Heart Failure Mother   . Hyperlipidemia Mother   . Hypertension Father   . Breast cancer Maternal Aunt   .  Heart disease Maternal Grandfather   . Asthma Maternal Grandmother   . Heart attack Maternal Grandmother   . Cancer Other   . Kidney disease Other   . Brain cancer Other   . Prostate cancer Other       Review of Systems  Constitutional: negative for fatigue and weight loss Respiratory: negative for cough and wheezing Cardiovascular: negative for chest pain, fatigue and palpitations Gastrointestinal: negative for abdominal pain and change in bowel habits Musculoskeletal:negative for myalgias Neurological: negative for gait problems and tremors Behavioral/Psych: negative for abusive  relationship, depression Endocrine: negative for temperature intolerance    Genitourinary: positive for vaginal discharge.  negative for abnormal menstrual periods, genital lesions, hot flashes, sexual problems  Integument/breast: negative for breast lump, breast tenderness, nipple discharge and skin lesion(s)    Objective:       BP 128/83   Pulse 99   Ht 5\' 6"  (1.676 m)   Wt 299 lb 14.4 oz (136 kg)   LMP 11/25/2020   BMI 48.41 kg/m  General:   alert and no distress  Skin:   no rash or abnormalities  Lungs:   clear to auscultation bilaterally  Heart:   regular rate and rhythm, S1, S2 normal, no murmur, click, rub or gallop  Breasts:   normal without suspicious masses, skin or nipple changes or axillary nodes  Abdomen:  normal findings: no organomegaly, soft, non-tender and no hernia  Pelvis:  External genitalia: normal general appearance Urinary system: urethral meatus normal and bladder without fullness, nontender Vaginal: normal without tenderness, induration or masses Cervix: normal appearance Adnexa: normal bimanual exam Uterus: anteverted and non-tender, normal size   Lab Review Urine pregnancy test Labs reviewed yes Radiologic studies reviewed yes  I have spent a total of 20 minutes of face-to-fac time, excluding clinical staff time, reviewing notes and preparing to see patient, ordering tests and/or medications, and counseling the patient.  Assessment:     1. Encounter for gynecological examination with Papanicolaou smear of cervix RX: - Cytology - PAP  2. Vaginal discharge Rx: - Cervicovaginal ancillary only  3. Screening examination for STD (sexually transmitted disease) Rx: - Hepatitis B surface antigen - Hepatitis C antibody - HIV Antibody (routine testing w rflx) - RPR  4. Screening breast examination Rx: - MM 3D SCREEN BREAST BILATERAL; Future   Plan:    Education reviewed: calcium supplements, depression evaluation, low fat, low cholesterol  diet, safe sex/STD prevention, self breast exams and weight bearing exercise. Mammogram ordered. Follow up in: 1 year.    Orders Placed This Encounter  Procedures  . MM 3D SCREEN BREAST BILATERAL    Standing Status:   Future    Standing Expiration Date:   12/17/2021    Order Specific Question:   Reason for Exam (SYMPTOM  OR DIAGNOSIS REQUIRED)    Answer:   Needs routine screening    Order Specific Question:   Is the patient pregnant?    Answer:   No    Order Specific Question:   Preferred imaging location?    Answer:   [facility]  . Hepatitis B surface antigen  . Hepatitis C antibody  . HIV Antibody (routine testing w rflx)  . RPR    [facility], MD 12/17/2020 9:04 AM

## 2020-12-18 LAB — CERVICOVAGINAL ANCILLARY ONLY
Bacterial Vaginitis (gardnerella): POSITIVE — AB
Candida Glabrata: NEGATIVE
Candida Vaginitis: POSITIVE — AB
Chlamydia: NEGATIVE
Comment: NEGATIVE
Comment: NEGATIVE
Comment: NEGATIVE
Comment: NEGATIVE
Comment: NEGATIVE
Comment: NORMAL
Neisseria Gonorrhea: NEGATIVE
Trichomonas: NEGATIVE

## 2020-12-18 LAB — HIV ANTIBODY (ROUTINE TESTING W REFLEX): HIV Screen 4th Generation wRfx: NONREACTIVE

## 2020-12-18 LAB — HEPATITIS B SURFACE ANTIGEN: Hepatitis B Surface Ag: NEGATIVE

## 2020-12-18 LAB — RPR: RPR Ser Ql: NONREACTIVE

## 2020-12-18 LAB — HEPATITIS C ANTIBODY: Hep C Virus Ab: 0.1 s/co ratio (ref 0.0–0.9)

## 2020-12-19 ENCOUNTER — Other Ambulatory Visit: Payer: Self-pay | Admitting: Obstetrics

## 2020-12-19 DIAGNOSIS — N760 Acute vaginitis: Secondary | ICD-10-CM

## 2020-12-19 DIAGNOSIS — B9689 Other specified bacterial agents as the cause of diseases classified elsewhere: Secondary | ICD-10-CM

## 2020-12-19 DIAGNOSIS — B379 Candidiasis, unspecified: Secondary | ICD-10-CM

## 2020-12-19 MED ORDER — METRONIDAZOLE 500 MG PO TABS: 500.0000 mg | ORAL_TABLET | Freq: Two times a day (BID) | ORAL | 2 refills | Status: DC

## 2020-12-19 MED ORDER — FLUCONAZOLE 150 MG PO TABS: 150.0000 mg | ORAL_TABLET | Freq: Once | ORAL | 0 refills | Status: AC

## 2020-12-23 LAB — CYTOLOGY - PAP
Comment: NEGATIVE
Diagnosis: UNDETERMINED — AB
High risk HPV: NEGATIVE

## 2021-02-26 ENCOUNTER — Ambulatory Visit
Admission: RE | Admit: 2021-02-26 | Discharge: 2021-02-26 | Disposition: A | Discharge status: Home / Self Care | Payer: 59 | Source: Ambulatory Visit | Attending: Obstetrics | Admitting: Obstetrics

## 2021-02-26 ENCOUNTER — Other Ambulatory Visit: Payer: Self-pay

## 2021-02-26 ENCOUNTER — Ambulatory Visit: Payer: No Typology Code available for payment source

## 2021-02-26 DIAGNOSIS — Z1239 Encounter for other screening for malignant neoplasm of breast: Secondary | ICD-10-CM

## 2021-02-26 DIAGNOSIS — Z1231 Encounter for screening mammogram for malignant neoplasm of breast: Secondary | ICD-10-CM | POA: Diagnosis not present

## 2021-04-19 ENCOUNTER — Encounter: Payer: Self-pay | Admitting: *Deleted

## 2021-04-19 ENCOUNTER — Telehealth: Payer: Self-pay | Admitting: *Deleted

## 2021-04-19 ENCOUNTER — Other Ambulatory Visit: Payer: Self-pay | Admitting: *Deleted

## 2021-04-19 DIAGNOSIS — B379 Candidiasis, unspecified: Secondary | ICD-10-CM

## 2021-04-19 MED ORDER — FLUCONAZOLE 150 MG PO TABS: 150.0000 mg | ORAL_TABLET | Freq: Once | ORAL | 0 refills | Status: AC

## 2021-04-19 NOTE — Telephone Encounter (Signed)
TC from patient requesting RX signs and symptoms of yeast infection. Patient had recent annual visit. RX for diflucan sent per protocol. Message sent to patient via MyChart with education information on vaginal yeast infections included.

## 2021-07-27 DIAGNOSIS — Z23 Encounter for immunization: Secondary | ICD-10-CM | POA: Diagnosis not present

## 2021-08-19 ENCOUNTER — Encounter: Payer: Self-pay | Admitting: *Deleted

## 2021-08-19 ENCOUNTER — Other Ambulatory Visit: Payer: Self-pay | Admitting: *Deleted

## 2021-08-19 DIAGNOSIS — B379 Candidiasis, unspecified: Secondary | ICD-10-CM

## 2021-08-19 MED ORDER — FLUCONAZOLE 150 MG PO TABS: 150.0000 mg | ORAL_TABLET | Freq: Once | ORAL | 0 refills | Status: AC

## 2022-02-08 DIAGNOSIS — H5213 Myopia, bilateral: Secondary | ICD-10-CM | POA: Diagnosis not present

## 2022-03-23 ENCOUNTER — Ambulatory Visit: Payer: 59 | Admitting: Obstetrics and Gynecology

## 2022-03-31 ENCOUNTER — Ambulatory Visit (HOSPITAL_COMMUNITY)
Admission: EM | Admit: 2022-03-31 | Discharge: 2022-03-31 | Disposition: A | Discharge status: Home / Self Care | Payer: Self-pay | Attending: Family Medicine | Admitting: Family Medicine

## 2022-03-31 ENCOUNTER — Encounter (HOSPITAL_COMMUNITY): Payer: Self-pay | Admitting: Emergency Medicine

## 2022-03-31 DIAGNOSIS — B2790 Infectious mononucleosis, unspecified without complication: Secondary | ICD-10-CM | POA: Insufficient documentation

## 2022-03-31 DIAGNOSIS — J029 Acute pharyngitis, unspecified: Secondary | ICD-10-CM | POA: Insufficient documentation

## 2022-03-31 DIAGNOSIS — L309 Dermatitis, unspecified: Secondary | ICD-10-CM | POA: Insufficient documentation

## 2022-03-31 LAB — POCT INFECTIOUS MONO SCREEN, ED / UC: Mono Screen: POSITIVE — AB

## 2022-03-31 LAB — POCT RAPID STREP A, ED / UC: Streptococcus, Group A Screen (Direct): NEGATIVE

## 2022-03-31 MED ORDER — KETOROLAC TROMETHAMINE 30 MG/ML IJ SOLN
INTRAMUSCULAR | Status: AC
  Filled 2022-03-31: qty 1

## 2022-03-31 MED ORDER — KETOROLAC TROMETHAMINE 30 MG/ML IJ SOLN
30.0000 mg | Freq: Once | INTRAMUSCULAR | Status: AC
  Administered 2022-03-31: 30 mg via INTRAMUSCULAR

## 2022-03-31 MED ORDER — PREDNISONE 20 MG PO TABS: 40.0000 mg | ORAL_TABLET | Freq: Every day | ORAL | 0 refills | Status: AC

## 2022-03-31 MED ORDER — TRAMADOL HCL 50 MG PO TABS: 50.0000 mg | ORAL_TABLET | Freq: Four times a day (QID) | ORAL | 0 refills | Status: AC | PRN

## 2022-03-31 MED ORDER — KETOCONAZOLE 2 % EX CREA: 1.0000 | TOPICAL_CREAM | Freq: Every day | CUTANEOUS | 0 refills | Status: AC

## 2022-03-31 NOTE — ED Triage Notes (Signed)
C/o sore throat with swallowing and fatigue Sunday. Took Mucinex and ibuprofen. Took 2 old amoxicillin had at the house. Rash started yesterday that is itching.

## 2022-03-31 NOTE — ED Provider Notes (Signed)
[facility]    CSN: [id number] Arrival date & time: 03/31/22  5400      History   Chief Complaint Chief Complaint  Patient presents with   Sore Throat   Rash    HPI Brooke Chan is a 41 y.o. female.    Sore Throat  Rash  Here with sore throat that began on September 3.  It hurts a lot to swallow.  She states she had a hard time getting fluids and due to the sore throat.  No nausea or vomiting or diarrhea.  No cough or congestion.  She is maybe had some ear pain.  Uncertain if she has had fever but possibly has had some subjective fever.  On September 3 she also had a rash show up in the skin fold in her lower abdomen.  It is irritated.  Then in the last 2 or 3 days she has developed a pruritic rash on her hands  Past Medical History:  Diagnosis Date   Anemia    Depression    GERD (gastroesophageal reflux disease)    Headache(784.0)    history migraine   Obese     Patient Active Problem List   Diagnosis Date Noted   Vitamin D deficiency 10/24/2017   Prediabetes 03/08/2016   Disruption of cesarean wound, postpartum 07/27/2013   Other complication of obstetrical surgical wounds, postpartum condition or complication 07/26/2013   Cesarean delivery delivered 07/12/2013   Pain aggravated by activities of daily living 03/07/2013   Candidiasis of vulva and vagina 03/07/2013   Pain 12/11/2012    Past Surgical History:  Procedure Laterality Date   CESAREAN SECTION     CESAREAN SECTION WITH BILATERAL TUBAL LIGATION Bilateral 07/12/2013   Procedure: REPEAT CESAREAN SECTION WITH BILATERAL TUBAL LIGATION;  Surgeon: Brock Bad, MD;  Location: [facility];  Service: Obstetrics;  Laterality: Bilateral;    OB History     Gravida  2   Para  2   Term  2   Preterm  0   AB  0   Living  2      SAB  0   IAB  0   Ectopic  0   Multiple  0   Live Births  2            Home Medications    Prior to Admission medications   Medication Sig  Start Date End Date Taking? Authorizing Provider  cetirizine (ZYRTEC) 10 MG tablet Take 10 mg by mouth daily. 09/08/21  Yes [provider]  ketoconazole (NIZORAL) 2 % cream Apply 1 Application topically daily. To affected area till better 03/31/22  Yes Jossalyn Forgione, Janace Aris, MD  predniSONE (DELTASONE) 20 MG tablet Take 2 tablets (40 mg total) by mouth daily with breakfast for 5 days. 03/31/22 04/05/22 Yes Everli Rother, Janace Aris, MD  traMADol (ULTRAM) 50 MG tablet Take 1 tablet (50 mg total) by mouth every 6 (six) hours as needed (pain). 03/31/22  Yes Zenia Resides, MD  phentermine 37.5 MG capsule Take 1 capsule (37.5 mg total) by mouth every morning. 05/22/18 12/17/19  Brock Bad, MD    Family History Family History  Problem Relation Age of Onset   Colitis Mother    Seizures Mother    Congestive Heart Failure Mother    Hyperlipidemia Mother    Hypertension Father    Breast cancer Maternal Aunt    Heart disease Maternal Grandfather    Asthma Maternal Grandmother  Heart attack Maternal Grandmother    Cancer Other    Kidney disease Other    Brain cancer Other    Prostate cancer Other     Social History Social History   Tobacco Use   Smoking status: Never   Smokeless tobacco: Never  Vaping Use   Vaping Use: Never used  Substance Use Topics   Alcohol use: No    Alcohol/week: 0.0 standard drinks of alcohol   Drug use: No     Allergies   Fish allergy, Shellfish allergy, and Penicillins   Review of Systems Review of Systems  Skin:  Positive for rash.     Physical Exam Triage Vital Signs ED Triage Vitals  Enc Vitals Group     BP 03/31/22 0845 120/82     Pulse Rate 03/31/22 0845 (!) 115     Resp 03/31/22 0845 19     Temp 03/31/22 0845 99.1 F (37.3 C)     Temp Source 03/31/22 0845 Oral     SpO2 03/31/22 0845 97 %     Weight --      Height --      Head Circumference --      Peak Flow --      Pain Score 03/31/22 0843 10     Pain Loc --      Pain Edu? --       Excl. in GC? --    No data found.  Updated Vital Signs BP 120/82 (BP Location: Left Arm)   Pulse (!) 115   Temp 99.1 F (37.3 C) (Oral)   Resp 19   LMP 03/10/2022   SpO2 97%   Visual Acuity Right Eye Distance:   Left Eye Distance:   Bilateral Distance:    Right Eye Near:   Left Eye Near:    Bilateral Near:     Physical Exam Vitals reviewed.  Constitutional:      General: She is not in acute distress.    Appearance: She is not toxic-appearing.  HENT:     Right Ear: Tympanic membrane and ear canal normal.     Left Ear: Tympanic membrane and ear canal normal.     Nose: Nose normal.     Mouth/Throat:     Mouth: Mucous membranes are moist.     Comments: Is some clear mucus in the oropharynx.  There is some erythema of the tonsillar pillars and the oropharynx.  There is no asymmetry and no tonsillar hypertrophy Eyes:     Extraocular Movements: Extraocular movements intact.     Conjunctiva/sclera: Conjunctivae normal.     Pupils: Pupils are equal, round, and reactive to light.  Cardiovascular:     Rate and Rhythm: Normal rate and regular rhythm.     Heart sounds: No murmur heard. Pulmonary:     Effort: Pulmonary effort is normal. No respiratory distress.     Breath sounds: No wheezing, rhonchi or rales.  Chest:     Chest wall: No tenderness.  Musculoskeletal:     Cervical back: Neck supple.  Lymphadenopathy:     Cervical: No cervical adenopathy.  Skin:    Capillary Refill: Capillary refill takes less than 2 seconds.     Coloration: Skin is not jaundiced or pale.  Neurological:     General: No focal deficit present.     Mental Status: She is alert and oriented to person, place, and time.  Psychiatric:        Behavior: Behavior normal.  UC Treatments / Results  Labs (all labs ordered are listed, but only abnormal results are displayed) Labs Reviewed  POCT INFECTIOUS MONO SCREEN, ED / UC - Abnormal; Notable for the following components:      Result  Value   Mono Screen POSITIVE (*)    All other components within normal limits  CULTURE, GROUP A STREP [facility])  POCT RAPID STREP A, ED / UC    EKG   Radiology No results found.  Procedures Procedures (including critical care time)  Medications Ordered in UC Medications  ketorolac (TORADOL) 30 MG/ML injection 30 mg (has no administration in time range)    Initial Impression / Assessment and Plan / UC Course  I have reviewed the triage vital signs and the nursing notes.  Pertinent labs & imaging results that were available during my care of the patient were reviewed by me and considered in my medical decision making (see chart for details).        Rapid strep test is negative, so throat culture is sent.  Monospot is positive Brief course of prednisone is provided due to her pruritic rash, and hopefully that will reduce her very painful throat.  Treatment for pain is also sent in Final Clinical Impressions(s) / UC Diagnoses   Final diagnoses:  Pharyngitis, unspecified etiology  Dermatitis  Infectious mononucleosis without complication, infectious mononucleosis due to unspecified organism     Discharge Instructions      The monotest is positive.  Your strep test is negative.  Culture of the throat will be sent, and staff will notify you if that is in turn positive.  Take prednisone 20 mg--2 daily for 5 days  Take tramadol 50 mg-- 1 tablet every 6 hours as needed for pain.  This medication can make you sleepy or dizzy  You have been given a shot of Toradol 30 mg today.;     ED Prescriptions     Medication Sig Dispense Auth. Provider   traMADol (ULTRAM) 50 MG tablet Take 1 tablet (50 mg total) by mouth every 6 (six) hours as needed (pain). 12 tablet Alfonzia Woolum, Janace Aris, MD   predniSONE (DELTASONE) 20 MG tablet Take 2 tablets (40 mg total) by mouth daily with breakfast for 5 days. 10 tablet Zenia Resides, MD   ketoconazole (NIZORAL) 2 % cream Apply 1  Application topically daily. To affected area till better 30 g Marlinda Mike Janace Aris, MD      I have reviewed the PDMP during this encounter.   Zenia Resides, MD 03/31/22 1011

## 2022-03-31 NOTE — Discharge Instructions (Addendum)
The monotest is positive.  Your strep test is negative.  Culture of the throat will be sent, and staff will notify you if that is in turn positive.  Take prednisone 20 mg--2 daily for 5 days  Take tramadol 50 mg-- 1 tablet every 6 hours as needed for pain.  This medication can make you sleepy or dizzy  You have been given a shot of Toradol 30 mg today.;

## 2022-04-02 LAB — CULTURE, GROUP A STREP (THRC)

## 2022-07-13 ENCOUNTER — Other Ambulatory Visit: Payer: Self-pay | Admitting: Emergency Medicine

## 2022-07-13 MED ORDER — FLUCONAZOLE 150 MG PO TABS: 150.0000 mg | ORAL_TABLET | Freq: Once | ORAL | 0 refills | Status: AC

## 2022-07-13 NOTE — Progress Notes (Signed)
Rx for vaginal itching. 

## 2022-07-27 ENCOUNTER — Ambulatory Visit: Payer: Self-pay | Admitting: Obstetrics

## 2022-07-27 ENCOUNTER — Encounter: Payer: Self-pay | Admitting: Emergency Medicine

## 2022-07-27 ENCOUNTER — Ambulatory Visit
Admission: EM | Admit: 2022-07-27 | Discharge: 2022-07-27 | Disposition: A | Discharge status: Home / Self Care | Payer: 59 | Attending: Physician Assistant | Admitting: Physician Assistant

## 2022-07-27 ENCOUNTER — Other Ambulatory Visit: Payer: Self-pay

## 2022-07-27 DIAGNOSIS — Z1152 Encounter for screening for COVID-19: Secondary | ICD-10-CM | POA: Diagnosis not present

## 2022-07-27 DIAGNOSIS — Z79899 Other long term (current) drug therapy: Secondary | ICD-10-CM | POA: Diagnosis not present

## 2022-07-27 DIAGNOSIS — J069 Acute upper respiratory infection, unspecified: Secondary | ICD-10-CM | POA: Diagnosis not present

## 2022-07-27 MED ORDER — NAPROXEN 375 MG PO TABS: 375.0000 mg | ORAL_TABLET | Freq: Two times a day (BID) | ORAL | 0 refills | Status: AC

## 2022-07-27 MED ORDER — PROMETHAZINE-DM 6.25-15 MG/5ML PO SYRP: 5.0000 mL | ORAL_SOLUTION | Freq: Four times a day (QID) | ORAL | 0 refills | Status: DC | PRN

## 2022-07-27 MED ORDER — FLUTICASONE PROPIONATE 50 MCG/ACT NA SUSP: 1.0000 | Freq: Every day | NASAL | 0 refills | Status: AC

## 2022-07-27 NOTE — ED Triage Notes (Signed)
Pt c/o cold like symptoms since Monday with productive cough, HA and sore throat.

## 2022-07-27 NOTE — ED Provider Notes (Signed)
[facility]    CSN: [id number] Arrival date & time: 07/27/22  1611      History   Chief Complaint Chief Complaint  Patient presents with   URI    HPI Brooke Chan is a 42 y.o. female.   Patient presents today with a 3-day history of URI symptoms including cough, headache, sore throat, sinus pressure.  Denies any fever, chest pain, shortness of breath, nausea, vomiting, diarrhea.  She has tried ibuprofen, cough drops, allergy medication without improvement of symptoms.  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  She has had COVID several years ago.  She has had her COVID-vaccine.  She denies any recent antibiotics or steroids.  She does not smoke.  Denies history of COPD, asthma, diabetes, chronic liver/kidney disease.    Past Medical History:  Diagnosis Date   Anemia    Depression    GERD (gastroesophageal reflux disease)    Headache(784.0)    history migraine   Obese     Patient Active Problem List   Diagnosis Date Noted   Vitamin D deficiency 10/24/2017   Prediabetes 03/08/2016   Disruption of cesarean wound, postpartum 78/29/5621   Other complication of obstetrical surgical wounds, postpartum condition or complication 30/86/5784   Cesarean delivery delivered 07/12/2013   Pain aggravated by activities of daily living 03/07/2013   Candidiasis of vulva and vagina 03/07/2013   Pain 12/11/2012    Past Surgical History:  Procedure Laterality Date   CESAREAN SECTION     CESAREAN SECTION WITH BILATERAL TUBAL LIGATION Bilateral 07/12/2013   Procedure: REPEAT CESAREAN SECTION WITH BILATERAL TUBAL LIGATION;  Surgeon: Shelly Bombard, MD;  Location: [facility];  Service: Obstetrics;  Laterality: Bilateral;    OB History     Gravida  2   Para  2   Term  2   Preterm  0   AB  0   Living  2      SAB  0   IAB  0   Ectopic  0   Multiple  0   Live Births  2            Home Medications    Prior to Admission medications    Medication Sig Start Date End Date Taking? Authorizing Provider  fluticasone (FLONASE) 50 MCG/ACT nasal spray Place 1 spray into both nostrils daily. 07/27/22  Yes Navdeep Fessenden K, PA-C  naproxen (NAPROSYN) 375 MG tablet Take 1 tablet (375 mg total) by mouth 2 (two) times daily. 07/27/22  Yes Lourdes Manning K, PA-C  promethazine-dextromethorphan (PROMETHAZINE-DM) 6.25-15 MG/5ML syrup Take 5 mLs by mouth 4 (four) times daily as needed for cough. 07/27/22  Yes Karam Dunson K, PA-C  cetirizine (ZYRTEC) 10 MG tablet Take 10 mg by mouth daily. 09/08/21   [provider]  ketoconazole (NIZORAL) 2 % cream Apply 1 Application topically daily. To affected area till better 03/31/22   Barrett Henle, MD  traMADol (ULTRAM) 50 MG tablet Take 1 tablet (50 mg total) by mouth every 6 (six) hours as needed (pain). 03/31/22   Barrett Henle, MD  phentermine 37.5 MG capsule Take 1 capsule (37.5 mg total) by mouth every morning. 05/22/18 12/17/19  Shelly Bombard, MD    Family History Family History  Problem Relation Age of Onset   Colitis Mother    Seizures Mother    Congestive Heart Failure Mother    Hyperlipidemia Mother    Hypertension Father  Breast cancer Maternal Aunt    Heart disease Maternal Grandfather    Asthma Maternal Grandmother    Heart attack Maternal Grandmother    Cancer Other    Kidney disease Other    Brain cancer Other    Prostate cancer Other     Social History Social History   Tobacco Use   Smoking status: Never   Smokeless tobacco: Never  Vaping Use   Vaping Use: Never used  Substance Use Topics   Alcohol use: No    Alcohol/week: 0.0 standard drinks of alcohol   Drug use: No     Allergies   Fish allergy, Shellfish allergy, and Penicillins   Review of Systems Review of Systems  Constitutional:  Positive for activity change and fatigue. Negative for appetite change and fever.  HENT:  Positive for congestion, postnasal drip, sinus pressure, sore throat and  voice change. Negative for sneezing and trouble swallowing.   Respiratory:  Positive for cough. Negative for shortness of breath.   Cardiovascular:  Negative for chest pain.  Gastrointestinal:  Negative for abdominal pain, diarrhea, nausea and vomiting.  Neurological:  Positive for headaches. Negative for dizziness and light-headedness.     Physical Exam Triage Vital Signs ED Triage Vitals  Enc Vitals Group     BP 07/27/22 1654 104/71     Pulse Rate 07/27/22 1654 96     Resp 07/27/22 1654 19     Temp 07/27/22 1654 98.3 F (36.8 C)     Temp Source 07/27/22 1654 Oral     SpO2 07/27/22 1654 99 %     Weight 07/27/22 1655 299 lb 13.2 oz (136 kg)     Height 07/27/22 1655 5\' 6"  (1.676 m)     Head Circumference --      Peak Flow --      Pain Score 07/27/22 1654 5     Pain Loc --      Pain Edu? --      Excl. in GC? --    No data found.  Updated Vital Signs BP 104/71 (BP Location: Right Arm)   Pulse 96   Temp 98.3 F (36.8 C) (Oral)   Resp 19   Ht 5\' 6"  (1.676 m)   Wt 299 lb 13.2 oz (136 kg)   LMP 07/25/2022   SpO2 99%   BMI 48.39 kg/m   Visual Acuity Right Eye Distance:   Left Eye Distance:   Bilateral Distance:    Right Eye Near:   Left Eye Near:    Bilateral Near:     Physical Exam Vitals reviewed.  Constitutional:      General: She is awake. She is not in acute distress.    Appearance: Normal appearance. She is well-developed. She is not ill-appearing.     Comments: Very pleasant female appears stated age in no acute distress sitting comfortably in exam room  HENT:     Head: Normocephalic and atraumatic.     Right Ear: Tympanic membrane, ear canal and external ear normal. Tympanic membrane is not erythematous or bulging.     Left Ear: Tympanic membrane, ear canal and external ear normal. Tympanic membrane is not erythematous or bulging.     Nose: Nose normal.     Mouth/Throat:     Pharynx: Uvula midline. Posterior oropharyngeal erythema present. No  oropharyngeal exudate.  Cardiovascular:     Rate and Rhythm: Normal rate and regular rhythm.     Heart sounds: Normal heart sounds, S1 normal and  S2 normal. No murmur heard. Pulmonary:     Effort: Pulmonary effort is normal.     Breath sounds: Normal breath sounds. No wheezing, rhonchi or rales.     Comments: Clear to auscultation bilaterally Psychiatric:        Behavior: Behavior is cooperative.      UC Treatments / Results  Labs (all labs ordered are listed, but only abnormal results are displayed) Labs Reviewed  SARS CORONAVIRUS 2 (TAT 6-24 HRS)    EKG   Radiology No results found.  Procedures Procedures (including critical care time)  Medications Ordered in UC Medications - No data to display  Initial Impression / Assessment and Plan / UC Course  I have reviewed the triage vital signs and the nursing notes.  Pertinent labs & imaging results that were available during my care of the patient were reviewed by me and considered in my medical decision making (see chart for details).     Patient is well-appearing, afebrile, nontoxic, nontachycardic, with oxygen saturation 99%.  No evidence of acute infection on physical exam that warrant initiation of antibiotics.  She is outside the window of effectiveness for Tamiflu so influenza testing was deferred.  She was tested for COVID and we discussed potential utility of antiviral therapy, however, given she is young and otherwise healthy through shared decision making ultimately decided not to use this medication if positive for COVID.  She was given symptomatic treatment including Promethazine DM for cough.  Discussed that this can be sedating and she is not to drive or drink alcohol with taking it.  She was given Naprosyn to help with her pain including headache with instruction not to take NSAIDs with this medication due to risk of GI bleeding.  She can use Mucinex, Flonase, Tylenol for additional symptom relief.  She is to rest  and drink plenty of fluid.  Discussed that if her symptoms worsen or she has any additional symptoms including high fever not responding medication, chest pain, shortness of breath, worsening cough, weakness that she needs to be seen immediately.  Strict return precautions given.  Work excuse note with current CDC return to work guidelines based on COVID test result provided during visit today.  Final Clinical Impressions(s) / UC Diagnoses   Final diagnoses:  Upper respiratory tract infection, unspecified type     Discharge Instructions      Monitor your MyChart for your COVID result.  If you have any questions please contact us.  Start Naprosyn twice a day to help with your headache and other pain.  Do not take additional NSAIDs with this medication including aspirin, ibuprofen/Advil, naproxen/Aleve.  You can use Promethazine DM for cough.  This will make you sleepy so do not drive or drink alcohol while taking it.  Use Flonase to help with your congestion.  You can use over-the-counter medication including Mucinex and Tylenol for additional symptom relief.  Make sure that you rest and drink plenty of fluid.  If your symptoms do not improving within a week please return for reevaluation.  If anything worsens you need to be seen immediately.     ED Prescriptions     Medication Sig Dispense Auth. Provider   naproxen (NAPROSYN) 375 MG tablet Take 1 tablet (375 mg total) by mouth 2 (two) times daily. 20 tablet Raj Landress K, PA-C   promethazine-dextromethorphan (PROMETHAZINE-DM) 6.25-15 MG/5ML syrup Take 5 mLs by mouth 4 (four) times daily as needed for cough. 118 mL Taylorann Tkach K, PA-C  fluticasone (FLONASE) 50 MCG/ACT nasal spray Place 1 spray into both nostrils daily. 16 g Zoie Sarin K, PA-C      PDMP not reviewed this encounter.   Jeani Hawking, PA-C 07/27/22 1810

## 2022-07-27 NOTE — Discharge Instructions (Signed)
Monitor your MyChart for your COVID result.  If you have any questions please contact us.  Start Naprosyn twice a day to help with your headache and other pain.  Do not take additional NSAIDs with this medication including aspirin, ibuprofen/Advil, naproxen/Aleve.  You can use Promethazine DM for cough.  This will make you sleepy so do not drive or drink alcohol while taking it.  Use Flonase to help with your congestion.  You can use over-the-counter medication including Mucinex and Tylenol for additional symptom relief.  Make sure that you rest and drink plenty of fluid.  If your symptoms do not improving within a week please return for reevaluation.  If anything worsens you need to be seen immediately.

## 2022-07-29 LAB — SARS CORONAVIRUS 2 (TAT 6-24 HRS): SARS Coronavirus 2: NEGATIVE

## 2022-09-13 ENCOUNTER — Other Ambulatory Visit (HOSPITAL_COMMUNITY)
Admission: RE | Admit: 2022-09-13 | Discharge: 2022-09-13 | Disposition: A | Discharge status: Home / Self Care | Payer: 59 | Source: Ambulatory Visit | Attending: Obstetrics | Admitting: Obstetrics

## 2022-09-13 ENCOUNTER — Ambulatory Visit: Payer: 59 | Admitting: Obstetrics

## 2022-09-13 ENCOUNTER — Encounter: Payer: Self-pay | Admitting: Obstetrics

## 2022-09-13 VITALS — BP 125/75 | HR 107 | Ht 66.5 in | Wt 312.0 lb

## 2022-09-13 DIAGNOSIS — N898 Other specified noninflammatory disorders of vagina: Secondary | ICD-10-CM | POA: Diagnosis not present

## 2022-09-13 DIAGNOSIS — Z113 Encounter for screening for infections with a predominantly sexual mode of transmission: Secondary | ICD-10-CM

## 2022-09-13 DIAGNOSIS — Z01419 Encounter for gynecological examination (general) (routine) without abnormal findings: Secondary | ICD-10-CM

## 2022-09-13 DIAGNOSIS — N944 Primary dysmenorrhea: Secondary | ICD-10-CM

## 2022-09-13 DIAGNOSIS — Z6841 Body Mass Index (BMI) 40.0 and over, adult: Secondary | ICD-10-CM

## 2022-09-13 DIAGNOSIS — Z1239 Encounter for other screening for malignant neoplasm of breast: Secondary | ICD-10-CM

## 2022-09-13 MED ORDER — PHENTERMINE HCL 37.5 MG PO CAPS: 37.5000 mg | ORAL_CAPSULE | ORAL | 2 refills | Status: AC

## 2022-09-13 MED ORDER — IBUPROFEN 800 MG PO TABS: 800.0000 mg | ORAL_TABLET | Freq: Three times a day (TID) | ORAL | 5 refills | Status: DC | PRN

## 2022-09-13 NOTE — Progress Notes (Signed)
Subjective:        Brooke Chan is a 42 y.o. female here for a routine exam.  Current complaints: Vaginal discharge.    Personal health questionnaire:  Is patient Ashkenazi Jewish, have a family history of breast and/or ovarian cancer: no Is there a family history of uterine cancer diagnosed at age < 22, gastrointestinal cancer, urinary tract cancer, family member who is a Field seismologist syndrome-associated carrier: no Is the patient overweight and hypertensive, family history of diabetes, personal history of gestational diabetes, preeclampsia or PCOS: no Is patient over 66, have PCOS,  family history of premature CHD under age 45, diabetes, smoke, have hypertension or peripheral artery disease:  no At any time, has a partner hit, kicked or otherwise hurt or frightened you?: no Over the past 2 weeks, have you felt down, depressed or hopeless?: no Over the past 2 weeks, have you felt little interest or pleasure in doing things?:no   Gynecologic History Patient's last menstrual period was 08/25/2022 (exact date). Contraception: tubal ligation Last Pap: 2022. Results were: normal Last mammogram: 2022. Results were: normal  Obstetric History OB History  Gravida Para Term Preterm AB Living  2 2 2 $ 0 0 2  SAB IAB Ectopic Multiple Live Births  0 0 0 0 2    # Outcome Date GA Lbr Len/2nd Weight Sex Delivery Anes PTL Lv  2 Term 07/12/13 [redacted]w[redacted]d  F CS-LTranv Spinal  LIV  1 Term 11/22/00    M CS-LTranv Spinal  LIV    Past Medical History:  Diagnosis Date   Anemia    Depression    GERD (gastroesophageal reflux disease)    Headache(784.0)    history migraine   Obese     Past Surgical History:  Procedure Laterality Date   CESAREAN SECTION     CESAREAN SECTION WITH BILATERAL TUBAL LIGATION Bilateral 07/12/2013   Procedure: REPEAT CESAREAN SECTION WITH BILATERAL TUBAL LIGATION;  Surgeon: CShelly Bombard MD;  [facility];  Service: Obstetrics;  Laterality: Bilateral;     Current  Outpatient Medications:    ibuprofen (ADVIL) 800 MG tablet, Take 1 tablet (800 mg total) by mouth every 8 (eight) hours as needed., Disp: 30 tablet, Rfl: 5   phentermine 37.5 MG capsule, Take 1 capsule (37.5 mg total) by mouth every morning., Disp: 30 capsule, Rfl: 2   cetirizine (ZYRTEC) 10 MG tablet, Take 10 mg by mouth daily., Disp: , Rfl:    fluticasone (FLONASE) 50 MCG/ACT nasal spray, Place 1 spray into both nostrils daily., Disp: 16 g, Rfl: 0   ketoconazole (NIZORAL) 2 % cream, Apply 1 Application topically daily. To affected area till better, Disp: 30 g, Rfl: 0   naproxen (NAPROSYN) 375 MG tablet, Take 1 tablet (375 mg total) by mouth 2 (two) times daily., Disp: 20 tablet, Rfl: 0   promethazine-dextromethorphan (PROMETHAZINE-DM) 6.25-15 MG/5ML syrup, Take 5 mLs by mouth 4 (four) times daily as needed for cough., Disp: 118 mL, Rfl: 0   traMADol (ULTRAM) 50 MG tablet, Take 1 tablet (50 mg total) by mouth every 6 (six) hours as needed (pain)., Disp: 12 tablet, Rfl: 0 Allergies  Allergen Reactions   Fish Allergy Hives   Shellfish Allergy Hives   Penicillins Hives    Social History   Tobacco Use   Smoking status: Never   Smokeless tobacco: Never  Substance Use Topics   Alcohol use: Yes    Family History  Problem Relation Age of Onset   Colitis Mother  Seizures Mother    Congestive Heart Failure Mother    Hyperlipidemia Mother    Hypertension Father    Breast cancer Maternal Aunt    Heart disease Maternal Grandfather    Asthma Maternal Grandmother    Heart attack Maternal Grandmother    Cancer Other    Kidney disease Other    Brain cancer Other    Prostate cancer Other       Review of Systems  Constitutional: negative for fatigue and weight loss Respiratory: negative for cough and wheezing Cardiovascular: negative for chest pain, fatigue and palpitations Gastrointestinal: negative for abdominal pain and change in bowel habits Musculoskeletal:negative for  myalgias Neurological: negative for gait problems and tremors Behavioral/Psych: negative for abusive relationship, depression Endocrine: negative for temperature intolerance    Genitourinary: positive for vaginal discharge.  negative for abnormal menstrual periods, genital lesions, hot flashes, sexual problems  Integument/breast: negative for breast lump, breast tenderness, nipple discharge and skin lesion(s)    Objective:       BP 125/75 (BP Location: Right Arm, Cuff Size: Large)   Pulse (!) 107   Ht 5' 6.5" (1.689 m)   Wt (!) 312 lb (141.5 kg)   LMP 08/25/2022 (Exact Date)   BMI 49.60 kg/m  General:   Alert and no distress  Skin:   no rash or abnormalities  Lungs:   clear to auscultation bilaterally  Heart:   regular rate and rhythm, S1, S2 normal, no murmur, click, rub or gallop  Breasts:   normal without suspicious masses, skin or nipple changes or axillary nodes  Abdomen:  normal findings: no organomegaly, soft, non-tender and no hernia  Pelvis:  External genitalia: normal general appearance Urinary system: urethral meatus normal and bladder without fullness, nontender Vaginal: normal without tenderness, induration or masses Cervix: normal appearance Adnexa: normal bimanual exam Uterus: anteverted and non-tender, normal size   Lab Review Urine pregnancy test Labs reviewed yes Radiologic studies reviewed yes  I have spent a total of 20 minutes of face-to-face time, excluding clinical staff time, reviewing notes and preparing to see patient, ordering tests and/or medications, and counseling the patient.   Assessment:    1. Encounter for gynecological examination with Papanicolaou smear of cervix Rx: - Cytology - PAP( [place])  2. Vaginal discharge Rx: - Cervicovaginal ancillary only( [place])  3. Screening examination for STD (sexually transmitted disease) Rx: - HIV antibody (with reflex) - Hepatitis C Antibody - Hepatitis B Surface AntiGEN - RPR  4.  Primary dysmenorrhea Rx: - ibuprofen (ADVIL) 800 MG tablet; Take 1 tablet (800 mg total) by mouth every 8 (eight) hours as needed.  Dispense: 30 tablet; Refill: 5  5. Screening breast examination Rx: - MM 3D SCREEN BREAST BILATERAL; Future  6. Class 3 severe obesity due to excess calories without serious comorbidity with body mass index (BMI) of 45.0 to 49.9 in adult [facility]) Rx: - phentermine 37.5 MG capsule; Take 1 capsule (37.5 mg total) by mouth every morning.  Dispense: 30 capsule; Refill: 2     Plan:    Education reviewed: calcium supplements, depression evaluation, low fat, low cholesterol diet, safe sex/STD prevention, self breast exams, and weight bearing exercise. Mammogram ordered. Follow up in: 1 year.   Meds ordered this encounter  Medications   phentermine 37.5 MG capsule    Sig: Take 1 capsule (37.5 mg total) by mouth every morning.    Dispense:  30 capsule    Refill:  2   ibuprofen (ADVIL) 800  MG tablet    Sig: Take 1 tablet (800 mg total) by mouth every 8 (eight) hours as needed.    Dispense:  30 tablet    Refill:  5   Orders Placed This Encounter  Procedures   MM 3D SCREEN BREAST BILATERAL    Standing Status:   Future    Standing Expiration Date:   09/13/2023    Order Specific Question:   Reason for Exam (SYMPTOM  OR DIAGNOSIS REQUIRED)    Answer:   Screening    Order Specific Question:   Is the patient pregnant?    Answer:   No    Order Specific Question:   Preferred imaging location?    Answer:   [facility]   HIV antibody (with reflex)   Hepatitis C Antibody   Hepatitis B Surface AntiGEN   RPR     Shelly Bombard, MD 09/13/2022 4:34 PM

## 2022-09-13 NOTE — Progress Notes (Signed)
41 y.o GYN presents for AEX/PAP/STD screening.  Pt has questions about her BTL 07/12/2013.  Last Mammogram 02/26/2021

## 2022-09-14 ENCOUNTER — Telehealth: Payer: Self-pay | Admitting: Emergency Medicine

## 2022-09-14 ENCOUNTER — Other Ambulatory Visit: Payer: Self-pay | Admitting: Obstetrics

## 2022-09-14 DIAGNOSIS — B9689 Other specified bacterial agents as the cause of diseases classified elsewhere: Secondary | ICD-10-CM

## 2022-09-14 LAB — CERVICOVAGINAL ANCILLARY ONLY
Bacterial Vaginitis (gardnerella): POSITIVE — AB
Candida Glabrata: NEGATIVE
Candida Vaginitis: NEGATIVE
Chlamydia: NEGATIVE
Comment: NEGATIVE
Comment: NEGATIVE
Comment: NEGATIVE
Comment: NEGATIVE
Comment: NEGATIVE
Comment: NORMAL
Neisseria Gonorrhea: NEGATIVE
Trichomonas: NEGATIVE

## 2022-09-14 LAB — HEPATITIS B SURFACE ANTIGEN: Hepatitis B Surface Ag: NEGATIVE

## 2022-09-14 LAB — HIV ANTIBODY (ROUTINE TESTING W REFLEX): HIV Screen 4th Generation wRfx: NONREACTIVE

## 2022-09-14 LAB — HEPATITIS C ANTIBODY: Hep C Virus Ab: NONREACTIVE

## 2022-09-14 LAB — RPR: RPR Ser Ql: NONREACTIVE

## 2022-09-14 MED ORDER — METRONIDAZOLE 500 MG PO TABS: 500.0000 mg | ORAL_TABLET | Freq: Two times a day (BID) | ORAL | 2 refills | Status: DC

## 2022-09-14 NOTE — Telephone Encounter (Signed)
Informed of results, Rx.

## 2022-09-15 LAB — CYTOLOGY - PAP
Comment: NEGATIVE
Diagnosis: NEGATIVE
High risk HPV: NEGATIVE

## 2022-09-17 ENCOUNTER — Ambulatory Visit (HOSPITAL_BASED_OUTPATIENT_CLINIC_OR_DEPARTMENT_OTHER)
Admission: RE | Admit: 2022-09-17 | Discharge: 2022-09-17 | Disposition: A | Discharge status: Home / Self Care | Payer: 59 | Source: Ambulatory Visit | Attending: Obstetrics | Admitting: Obstetrics

## 2022-09-17 DIAGNOSIS — Z1239 Encounter for other screening for malignant neoplasm of breast: Secondary | ICD-10-CM

## 2022-09-17 DIAGNOSIS — Z1231 Encounter for screening mammogram for malignant neoplasm of breast: Secondary | ICD-10-CM | POA: Insufficient documentation

## 2022-09-21 ENCOUNTER — Ambulatory Visit
Admission: EM | Admit: 2022-09-21 | Discharge: 2022-09-21 | Disposition: A | Discharge status: Home / Self Care | Payer: 59 | Attending: Nurse Practitioner | Admitting: Nurse Practitioner

## 2022-09-21 DIAGNOSIS — H1031 Unspecified acute conjunctivitis, right eye: Secondary | ICD-10-CM

## 2022-09-21 MED ORDER — POLYMYXIN B-TRIMETHOPRIM 10000-0.1 UNIT/ML-% OP SOLN: 1.0000 [drp] | OPHTHALMIC | 0 refills | Status: AC

## 2022-09-21 NOTE — ED Triage Notes (Signed)
Pt c/o redness, and irritation to right eye that began today.   Home interventions: none

## 2022-09-21 NOTE — ED Provider Notes (Signed)
[facility]    CSN: [id number] Arrival date & time: 09/21/22  1633      History   Chief Complaint Chief Complaint  Patient presents with   Eye Problem    HPI Brooke Chan is a 42 y.o. female presents for evaluation of eye redness.  Patient reports today while at work she began to have some irritation to her right eye with watery drainage, itching.  Denies any foreign body sensation, photosensitivity, injury to the eye, visual changes.  Denies any URI symptoms or allergy symptoms.  She wears glasses only no contacts.  She has not used any OTC medications.  No other concerns at this time.   Eye Problem Associated symptoms: discharge and redness     Past Medical History:  Diagnosis Date   Anemia    Depression    GERD (gastroesophageal reflux disease)    Headache(784.0)    history migraine   Obese     Patient Active Problem List   Diagnosis Date Noted   Vitamin D deficiency 10/24/2017   Prediabetes 03/08/2016   Disruption of cesarean wound, postpartum [id number]   Other complication of obstetrical surgical wounds, postpartum condition or complication [id number]   Cesarean delivery delivered 07/12/2013   Pain aggravated by activities of daily living 03/07/2013   Candidiasis of vulva and vagina 03/07/2013   Pain 12/11/2012    Past Surgical History:  Procedure Laterality Date   CESAREAN SECTION     CESAREAN SECTION WITH BILATERAL TUBAL LIGATION Bilateral 07/12/2013   Procedure: REPEAT CESAREAN SECTION WITH BILATERAL TUBAL LIGATION;  Surgeon: Shelly Bombard, MD;  Location: [facility];  Service: Obstetrics;  Laterality: Bilateral;    OB History     Gravida  2   Para  2   Term  2   Preterm  0   AB  0   Living  2      SAB  0   IAB  0   Ectopic  0   Multiple  0   Live Births  2            Home Medications    Prior to Admission medications   Medication Sig Start Date End Date Taking? Authorizing Provider  trimethoprim-polymyxin b  (POLYTRIM) ophthalmic solution Place 1 drop into the right eye every 4 (four) hours for 7 days. 09/21/22 09/28/22 Yes Melynda Ripple, NP  cetirizine (ZYRTEC) 10 MG tablet Take 10 mg by mouth daily. 09/08/21   [provider]  fluticasone (FLONASE) 50 MCG/ACT nasal spray Place 1 spray into both nostrils daily. 07/27/22   Raspet, Derry Skill, PA-C  ibuprofen (ADVIL) 800 MG tablet Take 1 tablet (800 mg total) by mouth every 8 (eight) hours as needed. 09/13/22   Shelly Bombard, MD  ketoconazole (NIZORAL) 2 % cream Apply 1 Application topically daily. To affected area till better 03/31/22   Barrett Henle, MD  metroNIDAZOLE (FLAGYL) 500 MG tablet Take 1 tablet (500 mg total) by mouth 2 (two) times daily. 09/14/22   Shelly Bombard, MD  naproxen (NAPROSYN) 375 MG tablet Take 1 tablet (375 mg total) by mouth 2 (two) times daily. 07/27/22   Raspet, Derry Skill, PA-C  phentermine 37.5 MG capsule Take 1 capsule (37.5 mg total) by mouth every morning. 09/13/22   Shelly Bombard, MD  promethazine-dextromethorphan (PROMETHAZINE-DM) 6.25-15 MG/5ML syrup Take 5 mLs by mouth 4 (four) times daily as needed for cough. 07/27/22   Raspet, Derry Skill, PA-C  traMADol (  ULTRAM) 50 MG tablet Take 1 tablet (50 mg total) by mouth every 6 (six) hours as needed (pain). 03/31/22   Barrett Henle, MD    Family History Family History  Problem Relation Age of Onset   Colitis Mother    Seizures Mother    Congestive Heart Failure Mother    Hyperlipidemia Mother    Hypertension Father    Breast cancer Maternal Aunt    Heart disease Maternal Grandfather    Asthma Maternal Grandmother    Heart attack Maternal Grandmother    Cancer Other    Kidney disease Other    Brain cancer Other    Prostate cancer Other     Social History Social History   Tobacco Use   Smoking status: Never   Smokeless tobacco: Never  Vaping Use   Vaping Use: Never used  Substance Use Topics   Alcohol use: Yes   Drug use: No     Allergies   Fish  allergy, Shellfish allergy, Metformin and related, and Penicillins   Review of Systems Review of Systems  Eyes:  Positive for discharge and redness.     Physical Exam Triage Vital Signs ED Triage Vitals  Enc Vitals Group     BP 09/21/22 1641 120/83     Pulse Rate 09/21/22 1641 (!) 113     Resp 09/21/22 1641 20     Temp 09/21/22 1641 99 F (37.2 C)     Temp Source 09/21/22 1641 Oral     SpO2 09/21/22 1641 98 %     Weight --      Height --      Head Circumference --      Peak Flow --      Pain Score 09/21/22 1640 7     Pain Loc --      Pain Edu? --      Excl. in [place]? --    No data found.  Updated Vital Signs BP 120/83 (BP Location: Right Arm)   Pulse (!) 113   Temp 99 F (37.2 C) (Oral)   Resp 20   LMP 08/25/2022 (Exact Date)   SpO2 98%   Visual Acuity Right Eye Distance:   Left Eye Distance:   Bilateral Distance:    Right Eye Near:   Left Eye Near:    Bilateral Near:     Physical Exam Vitals and nursing note reviewed.  Constitutional:      Appearance: Normal appearance.  HENT:     Head: Normocephalic and atraumatic.  Eyes:     General: Lids are normal.        Right eye: Discharge present. No foreign body or hordeolum.     Conjunctiva/sclera:     Right eye: Right conjunctiva is injected. No chemosis, exudate or hemorrhage.    Pupils: Pupils are equal, round, and reactive to light.  Cardiovascular:     Rate and Rhythm: Normal rate.  Pulmonary:     Effort: Pulmonary effort is normal.  Skin:    General: Skin is warm and dry.  Neurological:     General: No focal deficit present.     Mental Status: She is alert and oriented to person, place, and time.  Psychiatric:        Mood and Affect: Mood normal.        Behavior: Behavior normal.      UC Treatments / Results  Labs (all labs ordered are listed, but only abnormal results are displayed) Labs Reviewed -  No data to display  EKG   Radiology No results found.  Procedures Procedures  (including critical care time)  Medications Ordered in UC Medications - No data to display  Initial Impression / Assessment and Plan / UC Course  I have reviewed the triage vital signs and the nursing notes.  Pertinent labs & imaging results that were available during my care of the patient were reviewed by me and considered in my medical decision making (see chart for details).  Clinical Course as of 09/21/22 1656  Wed Sep 21, 2022  1656 HR re-check 98 [JM]    Clinical Course User Index [JM] Melynda Ripple, NP    Reviewed different types of conjunctivitis Trial of over-the-counter antihistamine eyedrops and if no improvement may start Polytrim eyedrops Warm compresses to the eye as needed Follow-up with PCP as needed ER precautions reviewed and patient verbalized understanding Final Clinical Impressions(s) / UC Diagnoses   Final diagnoses:  Acute conjunctivitis of right eye, unspecified acute conjunctivitis type     Discharge Instructions      Start over-the-counter antihistamine eyedrop such as Clear Eyes.  If symptoms do not improve or worsen you may start Polytrim antibiotic eyedrops as prescribed Warm compresses to the eye as needed Follow-up with your PCP if symptoms do not improve Please go to emergency room for any worsening symptoms    ED Prescriptions     Medication Sig Dispense Auth. Provider   trimethoprim-polymyxin b (POLYTRIM) ophthalmic solution Place 1 drop into the right eye every 4 (four) hours for 7 days. 10 mL Melynda Ripple, NP      PDMP not reviewed this encounter.   Melynda Ripple, NP 09/21/22 [phone]

## 2022-09-21 NOTE — Discharge Instructions (Signed)
Start over-the-counter antihistamine eyedrop such as Clear Eyes.  If symptoms do not improve or worsen you may start Polytrim antibiotic eyedrops as prescribed Warm compresses to the eye as needed Follow-up with your PCP if symptoms do not improve Please go to emergency room for any worsening symptoms

## 2022-12-25 ENCOUNTER — Other Ambulatory Visit: Payer: Self-pay | Admitting: Obstetrics

## 2022-12-26 ENCOUNTER — Other Ambulatory Visit: Payer: Self-pay | Admitting: Obstetrics

## 2023-04-28 IMAGING — MG MM DIGITAL SCREENING BILAT W/ TOMO AND CAD
6 of 10 series · 6 of 30 positions shown · non-contrast
Comparison: Previous exam(s).

CLINICAL DATA: Screening.

EXAM:
DIGITAL SCREENING BILATERAL MAMMOGRAM WITH TOMOSYNTHESIS AND CAD
TECHNIQUE: Bilateral screening digital craniocaudal and mediolateral oblique
mammograms were obtained. Bilateral screening digital breast
tomosynthesis was performed. The images were evaluated with
computer-aided detection.

[L CC synth-2D (1 of 2)]
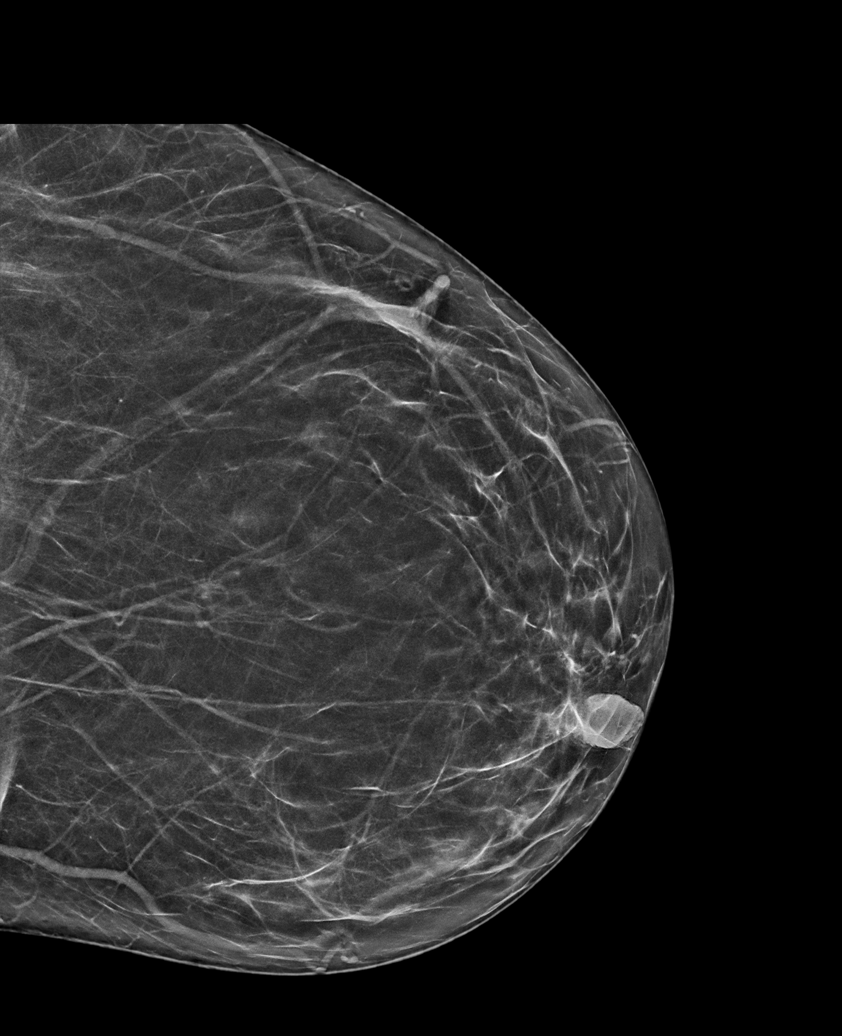

[R CC synth-2D]
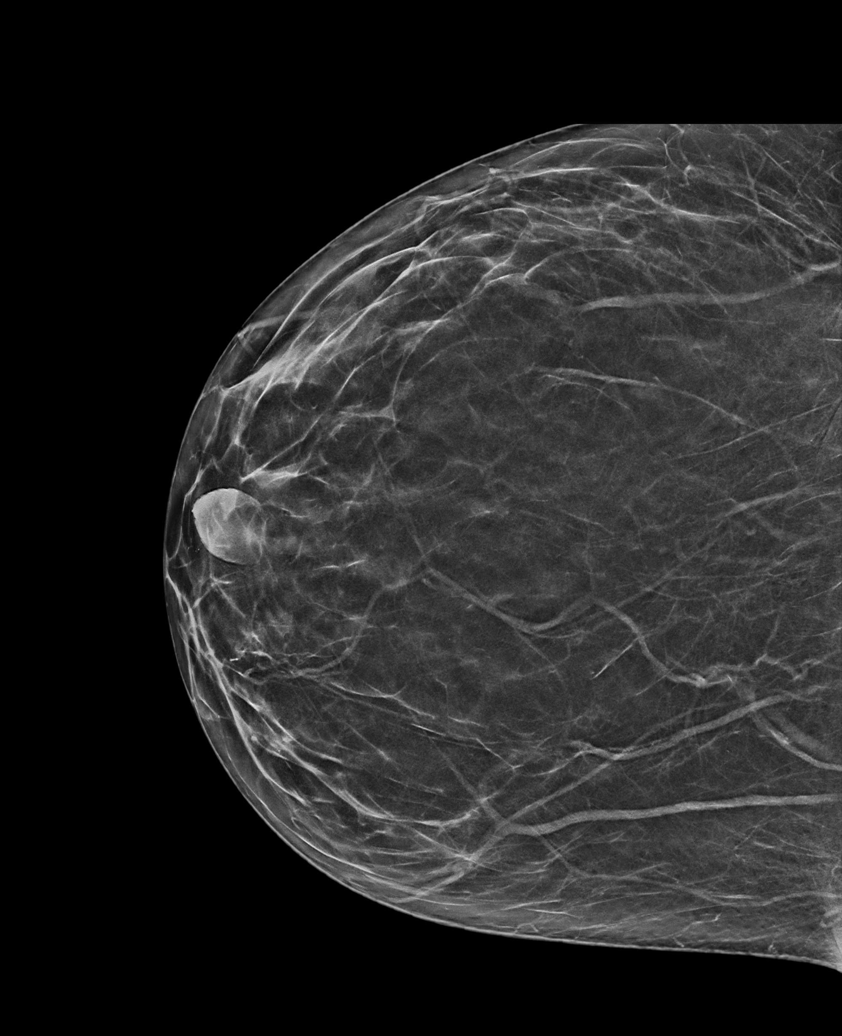

[R MLO synth-2D]
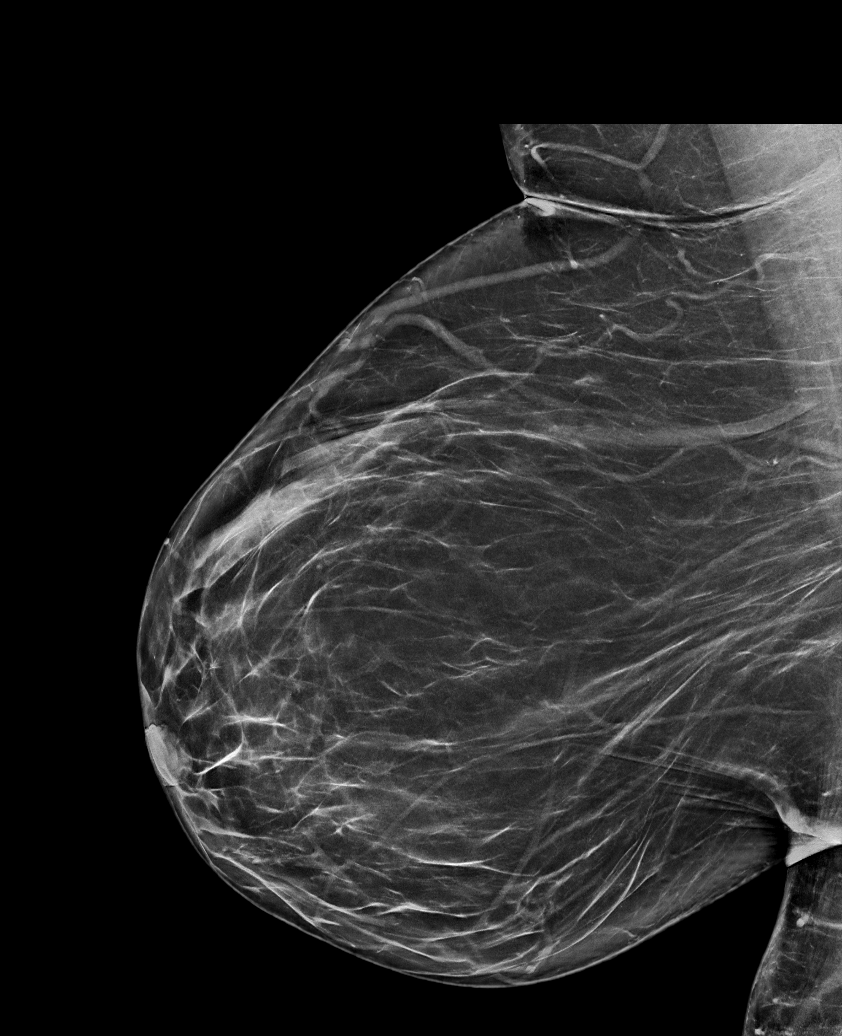

[L MLO synth-2D]
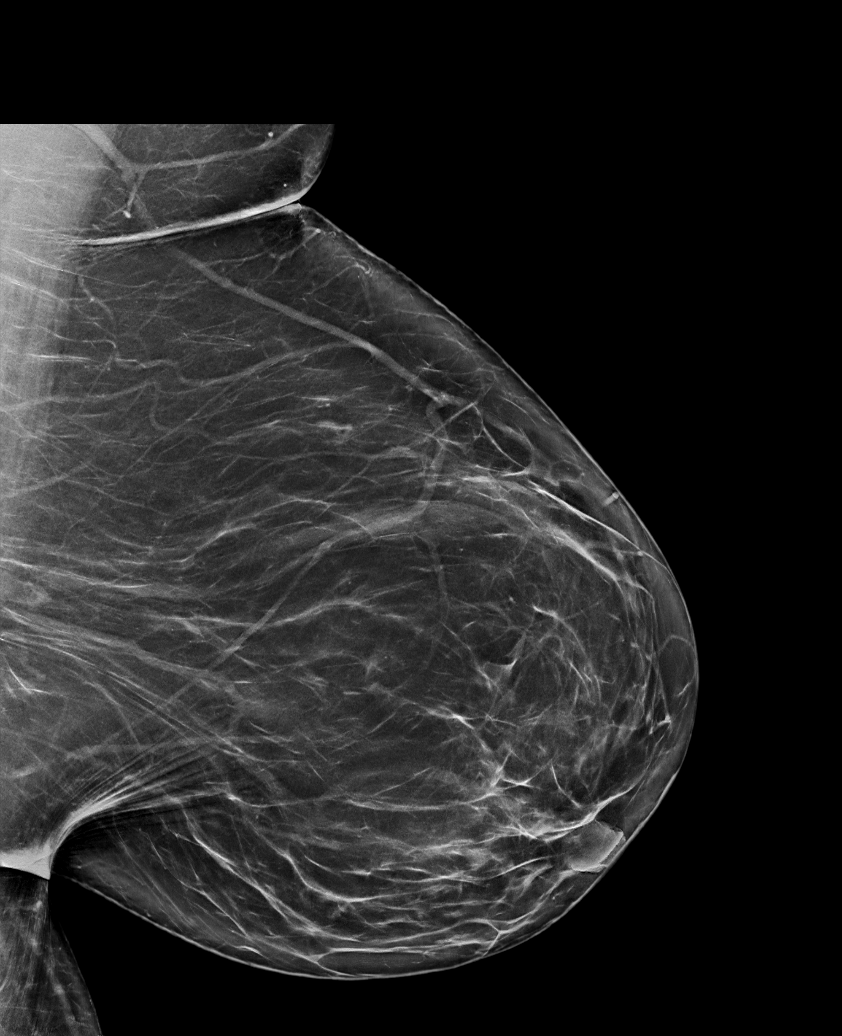

[L CC synth-2D (2 of 2)]
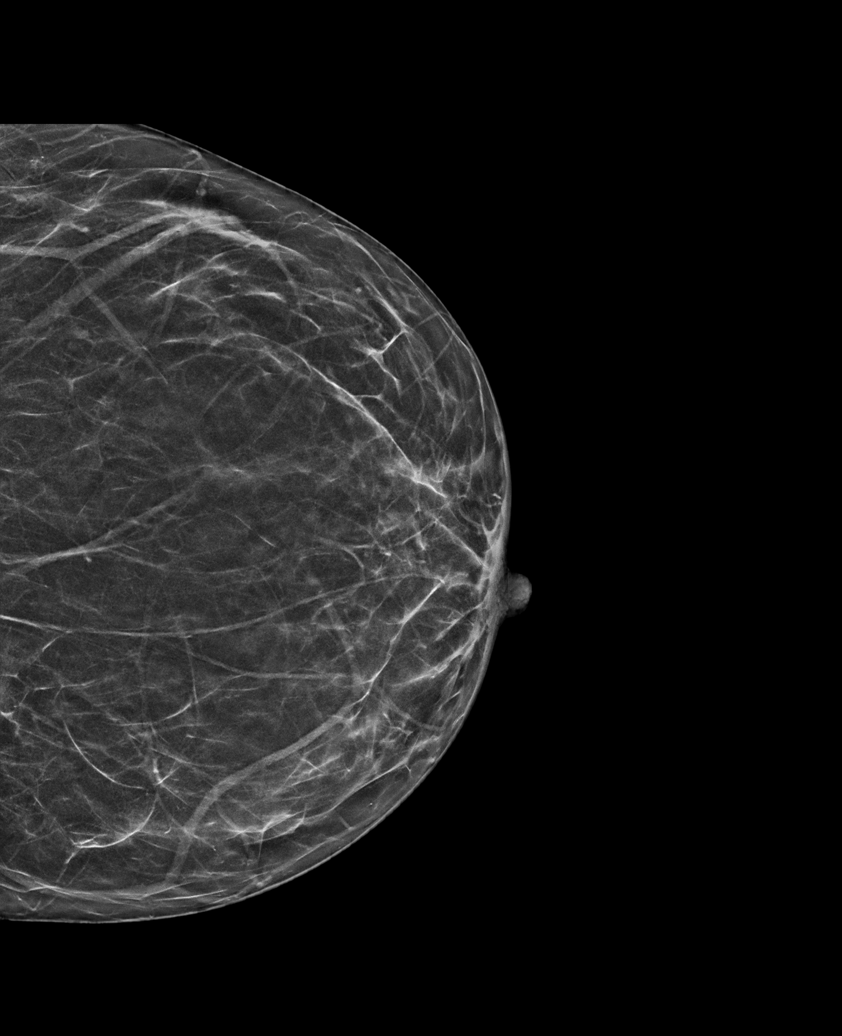

[L CC tomo · tomo slice 33/64.0]
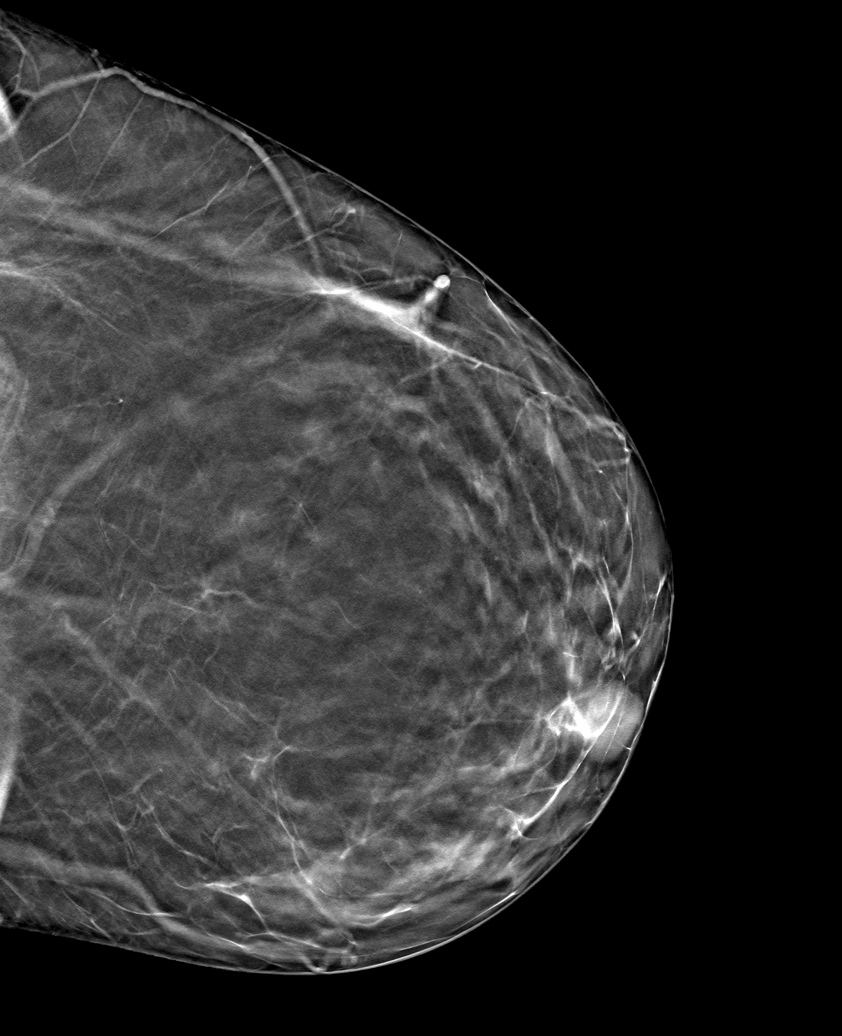

[6 of 30 positions shown; findings below may reference images not displayed]

ACR Breast Density Category b: There are scattered areas of
fibroglandular density.
FINDINGS: There are no findings suspicious for malignancy.
IMPRESSION: No mammographic evidence of malignancy. A result letter of this
screening mammogram will be mailed directly to the patient.

RECOMMENDATION:
Screening mammogram in one year. (Code:51-O-LD2)

BI-RADS CATEGORY  1: Negative.

## 2023-10-23 ENCOUNTER — Encounter: Payer: Self-pay | Admitting: Obstetrics

## 2023-10-23 ENCOUNTER — Other Ambulatory Visit (HOSPITAL_COMMUNITY)
Admission: RE | Admit: 2023-10-23 | Discharge: 2023-10-23 | Disposition: A | Discharge status: Home / Self Care | Source: Ambulatory Visit | Attending: Obstetrics | Admitting: Obstetrics

## 2023-10-23 ENCOUNTER — Ambulatory Visit (INDEPENDENT_AMBULATORY_CARE_PROVIDER_SITE_OTHER): Admitting: Obstetrics

## 2023-10-23 VITALS — BP 123/77 | HR 106 | Ht 66.0 in | Wt 266.8 lb

## 2023-10-23 DIAGNOSIS — R102 Pelvic and perineal pain: Secondary | ICD-10-CM | POA: Diagnosis not present

## 2023-10-23 DIAGNOSIS — N898 Other specified noninflammatory disorders of vagina: Secondary | ICD-10-CM

## 2023-10-23 DIAGNOSIS — E66813 Obesity, class 3: Secondary | ICD-10-CM

## 2023-10-23 DIAGNOSIS — N944 Primary dysmenorrhea: Secondary | ICD-10-CM | POA: Diagnosis not present

## 2023-10-23 DIAGNOSIS — Z6841 Body Mass Index (BMI) 40.0 and over, adult: Secondary | ICD-10-CM

## 2023-10-23 DIAGNOSIS — N760 Acute vaginitis: Secondary | ICD-10-CM

## 2023-10-23 DIAGNOSIS — B9689 Other specified bacterial agents as the cause of diseases classified elsewhere: Secondary | ICD-10-CM

## 2023-10-23 MED ORDER — IBUPROFEN 800 MG PO TABS: 800.0000 mg | ORAL_TABLET | Freq: Three times a day (TID) | ORAL | 5 refills | Status: AC | PRN

## 2023-10-23 MED ORDER — DOXYCYCLINE HYCLATE 100 MG PO TABS: 100.0000 mg | ORAL_TABLET | Freq: Two times a day (BID) | ORAL | 0 refills | Status: AC

## 2023-10-23 MED ORDER — METRONIDAZOLE 500 MG PO TABS: 500.0000 mg | ORAL_TABLET | Freq: Two times a day (BID) | ORAL | 2 refills | Status: DC

## 2023-10-23 MED ORDER — METRONIDAZOLE 500 MG PO TABS: 500.0000 mg | ORAL_TABLET | Freq: Two times a day (BID) | ORAL | 0 refills | Status: AC

## 2023-10-23 NOTE — Progress Notes (Signed)
Pt presents for pelvic pain

## 2023-10-23 NOTE — Progress Notes (Signed)
 Patient ID: Brooke Chan, female   DOB: 1981-03-20, 43 y.o.   MRN: [id number]  Chief Complaint  Patient presents with   Pelvic Pain    HPI Brooke Chan is a 43 y.o. female.  Complains of pelvic pain for the past [redacted] weeks along with a malodorous vaginal discharge. HPI  Past Medical History:  Diagnosis Date   Anemia    Depression    GERD (gastroesophageal reflux disease)    Headache(784.0)    history migraine   Obese     Past Surgical History:  Procedure Laterality Date   CESAREAN SECTION     CESAREAN SECTION WITH BILATERAL TUBAL LIGATION Bilateral 07/12/2013   Procedure: REPEAT CESAREAN SECTION WITH BILATERAL TUBAL LIGATION;  Surgeon: Brock Bad, MD;  Location: [facility];  Service: Obstetrics;  Laterality: Bilateral;    Family History  Problem Relation Age of Onset   Colitis Mother    Seizures Mother    Congestive Heart Failure Mother    Hyperlipidemia Mother    Hypertension Father    Breast cancer Maternal Aunt    Heart disease Maternal Grandfather    Asthma Maternal Grandmother    Heart attack Maternal Grandmother    Cancer Other    Kidney disease Other    Brain cancer Other    Prostate cancer Other     Social History Social History   Tobacco Use   Smoking status: Never   Smokeless tobacco: Never  Vaping Use   Vaping status: Never Used  Substance Use Topics   Alcohol use: Yes   Drug use: No    Allergies  Allergen Reactions   Fish Allergy Hives   Shellfish Allergy Hives   Metformin And Related Diarrhea   Penicillins Hives    Current Outpatient Medications  Medication Sig Dispense Refill   cetirizine (ZYRTEC) 10 MG tablet Take 10 mg by mouth daily.     fluticasone (FLONASE) 50 MCG/ACT nasal spray Place 1 spray into both nostrils daily. 16 g 0   doxycycline (VIBRA-TABS) 100 MG tablet Take 1 tablet (100 mg total) by mouth 2 (two) times daily. 28 tablet 0   ibuprofen (ADVIL) 800 MG tablet Take 1 tablet (800 mg total) by mouth every 8 (eight)  hours as needed. 30 tablet 5   ketoconazole (NIZORAL) 2 % cream Apply 1 Application topically daily. To affected area till better (Patient not taking: Reported on 10/23/2023) 30 g 0   metroNIDAZOLE (FLAGYL) 500 MG tablet Take 1 tablet (500 mg total) by mouth 2 (two) times daily. 28 tablet 0   naproxen (NAPROSYN) 375 MG tablet Take 1 tablet (375 mg total) by mouth 2 (two) times daily. (Patient not taking: Reported on 10/23/2023) 20 tablet 0   phentermine 37.5 MG capsule Take 1 capsule (37.5 mg total) by mouth every morning. (Patient not taking: Reported on 10/23/2023) 30 capsule 2   promethazine-dextromethorphan (PROMETHAZINE-DM) 6.25-15 MG/5ML syrup Take 5 mLs by mouth 4 (four) times daily as needed for cough. (Patient not taking: Reported on 10/23/2023) 118 mL 0   traMADol (ULTRAM) 50 MG tablet Take 1 tablet (50 mg total) by mouth every 6 (six) hours as needed (pain). (Patient not taking: Reported on 10/23/2023) 12 tablet 0   No current facility-administered medications for this visit.    Review of Systems Review of Systems Constitutional: negative for fatigue and weight loss Respiratory: negative for cough and wheezing Cardiovascular: negative for chest pain, fatigue and palpitations Gastrointestinal: negative for abdominal pain and change in  bowel habits Genitourinary: positive for pelvic pain and vaginal discharge  Integument/breast: negative for nipple discharge Musculoskeletal:negative for myalgias Neurological: negative for gait problems and tremors Behavioral/Psych: negative for abusive relationship, depression Endocrine: negative for temperature intolerance      Blood pressure 123/77, pulse (!) 106, height 5\' 6"  (1.676 m), weight 266 lb 12.8 oz (121 kg), last menstrual period 09/13/2023.  Physical Exam Physical Exam General:   Alert and no distress  Skin:   no rash or abnormalities  Lungs:   clear to auscultation bilaterally  Heart:   regular rate and rhythm, S1, S2 normal, no  murmur, click, rub or gallop  Breasts:   normal without suspicious masses, skin or nipple changes or axillary nodes  Abdomen:  normal findings: no organomegaly, soft, non-tender and no hernia  Pelvis:  External genitalia: normal general appearance Urinary system: urethral meatus normal and bladder without fullness, nontender Vaginal: normal without tenderness, induration or masses Cervix: normal appearance Adnexa: normal bimanual exam Uterus: anteverted and non-tender, normal size    I have spent a total of 20 minutes of face-to-face time, excluding clinical staff time, reviewing notes and preparing to see patient, ordering tests and/or medications, and counseling the patient.   Data Reviewed Wet Prep and Cultures  Assessment     .1. Pelvic pain (Primary) Rx: - US PELVIC COMPLETE WITH TRANSVAGINAL; Future - doxycycline (VIBRA-TABS) 100 MG tablet; Take 1 tablet (100 mg total) by mouth 2 (two) times daily.  Dispense: 28 tablet; Refill: 0 - metroNIDAZOLE (FLAGYL) 500 MG tablet; Take 1 tablet (500 mg total) by mouth 2 (two) times daily.  Dispense: 28 tablet; Refill: 0  2. Vaginal discharge Rx: - Cervicovaginal ancillary only( [place])  3. BV (bacterial vaginosis) - Flagyl Rx  4. Primary dysmenorrhea Rx: - ibuprofen (ADVIL) 800 MG tablet; Take 1 tablet (800 mg total) by mouth every 8 (eight) hours as needed.  Dispense: 30 tablet; Refill: 5  5. Class 3 severe obesity due to excess calories without serious comorbidity with body mass index (BMI) of 45.0 to 49.9 in adult [facility]) - taking Wegovy, and has lost ~ 50 lbs. - weight reduction with the aid of dietary changes, exercise and behavioral modification also recommended     Plan   Follow up in 2 weeks.  MyChart.  Orders Placed This Encounter  Procedures   US PELVIC COMPLETE WITH TRANSVAGINAL    Standing Status:   Future    Expiration Date:   10/22/2024    Reason for Exam (SYMPTOM  OR DIAGNOSIS REQUIRED):   Pelvic pain     Preferred imaging location?:   [facility]   Meds ordered this encounter  Medications   ibuprofen (ADVIL) 800 MG tablet    Sig: Take 1 tablet (800 mg total) by mouth every 8 (eight) hours as needed.    Dispense:  30 tablet    Refill:  5   DISCONTD: metroNIDAZOLE (FLAGYL) 500 MG tablet    Sig: Take 1 tablet (500 mg total) by mouth 2 (two) times daily.    Dispense:  14 tablet    Refill:  2   doxycycline (VIBRA-TABS) 100 MG tablet    Sig: Take 1 tablet (100 mg total) by mouth 2 (two) times daily.    Dispense:  28 tablet    Refill:  0   metroNIDAZOLE (FLAGYL) 500 MG tablet    Sig: Take 1 tablet (500 mg total) by mouth 2 (two) times daily.    Dispense:  28 tablet  Refill:  0     Brock Bad,  MD, FACOG Attending Obstetrician & Gynecologist, [facility] for [facility], [facility], [place] 10/23/2023

## 2023-10-24 ENCOUNTER — Ambulatory Visit (HOSPITAL_COMMUNITY)
Admission: RE | Admit: 2023-10-24 | Discharge: 2023-10-24 | Disposition: A | Discharge status: Home / Self Care | Source: Ambulatory Visit | Attending: Obstetrics | Admitting: Obstetrics

## 2023-10-24 DIAGNOSIS — R102 Pelvic and perineal pain: Secondary | ICD-10-CM | POA: Insufficient documentation

## 2023-10-24 LAB — CERVICOVAGINAL ANCILLARY ONLY
Bacterial Vaginitis (gardnerella): NEGATIVE
Candida Glabrata: NEGATIVE
Candida Vaginitis: NEGATIVE
Chlamydia: NEGATIVE
Comment: NEGATIVE
Comment: NEGATIVE
Comment: NEGATIVE
Comment: NEGATIVE
Comment: NEGATIVE
Comment: NORMAL
Neisseria Gonorrhea: NEGATIVE
Trichomonas: NEGATIVE

## 2023-11-07 ENCOUNTER — Encounter: Payer: Self-pay | Admitting: Obstetrics

## 2023-11-07 ENCOUNTER — Telehealth (INDEPENDENT_AMBULATORY_CARE_PROVIDER_SITE_OTHER): Admitting: Obstetrics

## 2023-11-07 VITALS — Ht 66.0 in | Wt 266.0 lb

## 2023-11-07 DIAGNOSIS — N944 Primary dysmenorrhea: Secondary | ICD-10-CM | POA: Diagnosis not present

## 2023-11-07 DIAGNOSIS — Z6841 Body Mass Index (BMI) 40.0 and over, adult: Secondary | ICD-10-CM | POA: Diagnosis not present

## 2023-11-07 DIAGNOSIS — E66813 Obesity, class 3: Secondary | ICD-10-CM

## 2023-11-07 DIAGNOSIS — R102 Pelvic and perineal pain: Secondary | ICD-10-CM

## 2023-11-07 DIAGNOSIS — B9689 Other specified bacterial agents as the cause of diseases classified elsewhere: Secondary | ICD-10-CM

## 2023-11-07 NOTE — Progress Notes (Unsigned)
 GYNECOLOGY VIRTUAL VISIT ENCOUNTER NOTE  Provider location: [facility] at {Blank single:[id number]::"[facility]"}   Patient location: Home  I connected with Brooke Chan on 11/07/23 at  2:30 PM EDT by MyChart Video Encounter and verified that I am speaking with the correct person using two identifiers.   I discussed the limitations, risks, security and privacy concerns of performing an evaluation and management service virtually and the availability of in person appointments. I also discussed with the patient that there may be a patient responsible charge related to this service. The patient expressed understanding and agreed to proceed.   History:  Brooke Chan is a 43 y.o. G5P2002 female being evaluated today for ***. She denies any abnormal vaginal discharge, bleeding, pelvic pain or other concerns.       Past Medical History:  Diagnosis Date   Anemia    Depression    GERD (gastroesophageal reflux disease)    Headache(784.0)    history migraine   Obese    Past Surgical History:  Procedure Laterality Date   CESAREAN SECTION     CESAREAN SECTION WITH BILATERAL TUBAL LIGATION Bilateral 07/12/2013   Procedure: REPEAT CESAREAN SECTION WITH BILATERAL TUBAL LIGATION;  Surgeon: Brock Bad, MD;  Location: [facility];  Service: Obstetrics;  Laterality: Bilateral;   The following portions of the patient's history were reviewed and updated as appropriate: allergies, current medications, past family history, past medical history, past social history, past surgical history and problem list.   Health Maintenance:  Normal pap and negative HRHPV on ***.  Normal mammogram on ***.   Review of Systems:  Pertinent items noted in HPI and remainder of comprehensive ROS otherwise negative.  Physical Exam:   General:  Alert, oriented and cooperative. Patient  appears to be in no acute distress.  Mental Status: Normal mood and affect. Normal behavior. Normal judgment and thought content.   Respiratory: Normal respiratory effort, no problems with respiration noted  Rest of physical exam deferred due to type of encounter  Labs and Imaging No results found for this or any previous visit (from the past 2 weeks). US PELVIC COMPLETE WITH TRANSVAGINAL Result Date: 10/25/2023 CLINICAL DATA:  Pelvic pain for 3 weeks. EXAM: TRANSABDOMINAL AND TRANSVAGINAL ULTRASOUND OF PELVIS TECHNIQUE: Both transabdominal and transvaginal ultrasound examinations of the pelvis were performed. Transabdominal technique was performed for global imaging of the pelvis including uterus, ovaries, adnexal regions, and pelvic cul-de-sac. It was necessary to proceed with endovaginal exam following the transabdominal exam to visualize the ovaries and endometrium. COMPARISON:  February 08, 2019 FINDINGS: Uterus Measurements: 6.4 x 5 x 4.7 cm = volume: 78.3 mL. No fibroids or other mass visualized. Endometrium Thickness: 4 mm.  No focal abnormality visualized. Right ovary Measurements: 2 x 1.2 x 1.6 cm = volume: 2 mL. Normal appearance/no adnexal mass. Left ovary Not seen. Other findings No abnormal free fluid. IMPRESSION: No acute abnormality identified. Left ovary not seen. Electronically Signed   By: Sherian Rein M.D.   On: 10/25/2023 08:48       Assessment and Plan:     1. Pelvic pain (Primary) ***  2. Primary dysmenorrhea ***  3. BV (bacterial vaginosis) ***  4. Class 3 severe obesity due to excess calories without serious comorbidity with body mass index (BMI) of 45.0 to 49.9 in adult [facility]) ***       I discussed the assessment and treatment plan with the patient. The patient was  provided an opportunity to ask questions and all were answered. The patient agreed with the plan and demonstrated an understanding of the instructions.   The patient was advised to call back or seek an  in-person evaluation/go to the ED if the symptoms worsen or if the condition fails to improve as anticipated.  I provided *** minutes of face-to-face time during this encounter. I also spent *** minutes dedicated to the care of this patient including pre-visit review of records, post visit ordering of medications and appropriate tests or procedures, coordinating care and documenting this visit encounter.    Elana Grayer, MD [facility], [facility], [facility] 11/07/2023

## 2023-11-07 NOTE — Progress Notes (Unsigned)
 Review test results. No problems today.

## 2024-06-29 ENCOUNTER — Ambulatory Visit
Admission: EM | Admit: 2024-06-29 | Discharge: 2024-06-29 | Disposition: A | Discharge status: Home / Self Care | Attending: Family Medicine | Admitting: Family Medicine

## 2024-06-29 DIAGNOSIS — B9789 Other viral agents as the cause of diseases classified elsewhere: Secondary | ICD-10-CM | POA: Diagnosis not present

## 2024-06-29 DIAGNOSIS — J988 Other specified respiratory disorders: Secondary | ICD-10-CM

## 2024-06-29 DIAGNOSIS — J040 Acute laryngitis: Secondary | ICD-10-CM

## 2024-06-29 DIAGNOSIS — J309 Allergic rhinitis, unspecified: Secondary | ICD-10-CM | POA: Diagnosis not present

## 2024-06-29 LAB — POCT RAPID STREP A (OFFICE): Rapid Strep A Screen: NEGATIVE

## 2024-06-29 LAB — POC COVID19/FLU A&B COMBO
Covid Antigen, POC: NEGATIVE
Influenza A Antigen, POC: NEGATIVE
Influenza B Antigen, POC: NEGATIVE

## 2024-06-29 MED ORDER — PREDNISONE 20 MG PO TABS: ORAL_TABLET | ORAL | 0 refills | Status: AC

## 2024-06-29 MED ORDER — PROMETHAZINE-DM 6.25-15 MG/5ML PO SYRP: 5.0000 mL | ORAL_SOLUTION | Freq: Three times a day (TID) | ORAL | 0 refills | Status: AC | PRN

## 2024-06-29 NOTE — ED Provider Notes (Signed)
 [facility]  Note:  This document was prepared using Conservation officer, historic buildings and may include unintentional dictation errors.  MRN: [id number] DOB: July 09, 1981  Subjective:   Brooke Chan is a 43 y.o. female presenting for 3-day history of persistent sneezing, sinus drainage, sinus congestion, throat pain, hoarseness, coughing.  Has allergic rhinitis and has been using her Zyrtec , pseudoephedrine consistently.  Has also been using ibuprofen , NyQuil.  Has been getting only very temporary relief.  Previously has done much better with steroids.  No current facility-administered medications for this encounter.  Current Outpatient Medications:    Pseudoeph-Doxylamine-DM-APAP (NYQUIL PO), Take by mouth., Disp: , Rfl:    cetirizine  (ZYRTEC ) 10 MG tablet, Take 10 mg by mouth daily., Disp: , Rfl:    doxycycline  (VIBRA -TABS) 100 MG tablet, Take 1 tablet (100 mg total) by mouth 2 (two) times daily. (Patient not taking: Reported on 11/07/2023), Disp: 28 tablet, Rfl: 0   fluticasone  (FLONASE ) 50 MCG/ACT nasal spray, Place 1 spray into both nostrils daily., Disp: 16 g, Rfl: 0   ibuprofen  (ADVIL ) 800 MG tablet, Take 1 tablet (800 mg total) by mouth every 8 (eight) hours as needed., Disp: 30 tablet, Rfl: 5   ketoconazole  (NIZORAL ) 2 % cream, Apply 1 Application topically daily. To affected area till better (Patient not taking: Reported on 10/23/2023), Disp: 30 g, Rfl: 0   metroNIDAZOLE  (FLAGYL ) 500 MG tablet, Take 1 tablet (500 mg total) by mouth 2 (two) times daily. (Patient not taking: Reported on 11/07/2023), Disp: 28 tablet, Rfl: 0   naproxen  (NAPROSYN ) 375 MG tablet, Take 1 tablet (375 mg total) by mouth 2 (two) times daily. (Patient not taking: Reported on 10/23/2023), Disp: 20 tablet, Rfl: 0   phentermine  37.5 MG capsule, Take 1 capsule (37.5 mg total) by mouth every morning. (Patient not taking: Reported on 10/23/2023), Disp: 30 capsule, Rfl: 2    promethazine -dextromethorphan  (PROMETHAZINE -DM) 6.25-15 MG/5ML syrup, Take 5 mLs by mouth 4 (four) times daily as needed for cough. (Patient not taking: Reported on 10/23/2023), Disp: 118 mL, Rfl: 0   traMADol  (ULTRAM ) 50 MG tablet, Take 1 tablet (50 mg total) by mouth every 6 (six) hours as needed (pain). (Patient not taking: Reported on 10/23/2023), Disp: 12 tablet, Rfl: 0   Allergies  Allergen Reactions   Fish Allergy Hives   Shellfish Allergy Hives   Metformin And Related Diarrhea   Penicillins Hives    Past Medical History:  Diagnosis Date   Anemia    Depression    GERD (gastroesophageal reflux disease)    Headache(784.0)    history migraine   Obese      Past Surgical History:  Procedure Laterality Date   CESAREAN SECTION     CESAREAN SECTION WITH BILATERAL TUBAL LIGATION Bilateral 07/12/2013   Procedure: REPEAT CESAREAN SECTION WITH BILATERAL TUBAL LIGATION;  Surgeon: Carlin DELENA Centers, MD;  Location: [facility];  Service: Obstetrics;  Laterality: Bilateral;    Family History  Problem Relation Age of Onset   Colitis Mother    Seizures Mother    Congestive Heart Failure Mother    Hyperlipidemia Mother    Hypertension Father    Breast cancer Maternal Aunt    Heart disease Maternal Grandfather    Asthma Maternal Grandmother    Heart attack Maternal Grandmother    Cancer Other    Kidney disease Other    Brain cancer Other    Prostate cancer Other     Social History   Tobacco  Use   Smoking status: Never   Smokeless tobacco: Never  Vaping Use   Vaping status: Never Used  Substance Use Topics   Alcohol use: Yes   Drug use: No    ROS   Objective:   Vitals: BP 111/77 (BP Location: Left Arm)   Pulse 98   Temp 99.3 F (37.4 C) (Oral)   Resp 18   LMP 06/10/2024 (Approximate)   SpO2 98%   Physical Exam Constitutional:      General: She is not in acute distress.    Appearance: Normal appearance. She is well-developed and normal weight. She is not  ill-appearing, toxic-appearing or diaphoretic.  HENT:     Head: Normocephalic and atraumatic.     Right Ear: Ear canal and external ear normal. No drainage or tenderness. A middle ear effusion is present. There is no impacted cerumen. Tympanic membrane is not erythematous or bulging.     Left Ear: Ear canal and external ear normal. No drainage or tenderness. A middle ear effusion is present. There is no impacted cerumen. Tympanic membrane is not erythematous or bulging.     Nose: Congestion present. No rhinorrhea.     Mouth/Throat:     Mouth: Mucous membranes are moist. No oral lesions.     Pharynx: No pharyngeal swelling, oropharyngeal exudate, posterior oropharyngeal erythema or uvula swelling.     Tonsils: No tonsillar exudate or tonsillar abscesses.     Comments: Thick streaks of postnasal drainage overlying pharynx. Hoarseness noted.  Eyes:     General: No scleral icterus.       Right eye: No discharge.        Left eye: No discharge.     Extraocular Movements: Extraocular movements intact.     Right eye: Normal extraocular motion.     Left eye: Normal extraocular motion.     Conjunctiva/sclera: Conjunctivae normal.  Cardiovascular:     Rate and Rhythm: Normal rate and regular rhythm.     Heart sounds: Normal heart sounds. No murmur heard.    No friction rub. No gallop.  Pulmonary:     Effort: Pulmonary effort is normal. No respiratory distress.     Breath sounds: No stridor. No wheezing, rhonchi or rales.  Chest:     Chest wall: No tenderness.  Musculoskeletal:     Cervical back: Normal range of motion and neck supple.  Lymphadenopathy:     Cervical: No cervical adenopathy.  Skin:    General: Skin is warm and dry.  Neurological:     General: No focal deficit present.     Mental Status: She is alert and oriented to person, place, and time.  Psychiatric:        Mood and Affect: Mood normal.        Behavior: Behavior normal.     Results for orders placed or performed  during the hospital encounter of 06/29/24 (from the past 24 hours)  POCT rapid strep A     Status: Normal   Collection Time: 06/29/24  1:24 PM  Result Value Ref Range   Rapid Strep A Screen Negative Negative  POC Covid + Flu A/B Antigen     Status: None   Collection Time: 06/29/24  1:25 PM  Result Value Ref Range   Influenza A Antigen, POC Negative Negative   Influenza B Antigen, POC Negative Negative   Covid Antigen, POC Negative Negative    Assessment and Plan :   PDMP not reviewed this encounter.  1. Viral  respiratory infection   2. Allergic rhinitis, unspecified seasonality, unspecified trigger   3. Laryngitis    Offered patient prednisone  for her underlying allergic rhinitis, active laryngitis and significant respiratory symptoms.  Use supportive care otherwise for viral respiratory infection.  Deferred imaging given clear cardiopulmonary exam, hemodynamically stable vital signs.  Counseled patient on potential for adverse effects with medications prescribed/recommended today, ER and return-to-clinic precautions discussed, patient verbalized understanding.    Christopher Savannah, PA-C 06/29/24 1343

## 2024-06-29 NOTE — ED Triage Notes (Signed)
 Pt reports sore throat, cough and sneezing x 2-3 days; throat itchy started this morning. Nyquil gives some relief.

## 2024-06-29 NOTE — Discharge Instructions (Addendum)
 We will manage this as a viral respiratory infection. For sore throat or cough try using a honey-based tea. Use 3 teaspoons of honey with juice squeezed from half lemon. Place shaved pieces of ginger into 1/2-1 cup of water and warm over stove top. Then mix the ingredients and repeat every 4 hours as needed. Please take Tylenol  500mg -650mg  once every 6 hours for fevers, aches and pains. Hydrate very well with at least 2 liters (64 ounces) of water. Eat light meals such as soups (chicken and noodles, chicken wild rice, vegetable).  Do not eat any foods that you are allergic to.  Start an antihistamine like Zyrtec  (10mg  daily) for postnasal drainage, sinus congestion.  You can take this together with prednisone  to help with your sinus inflammation. Use cough syrup as needed.

## 2024-09-02 ENCOUNTER — Ambulatory Visit: Payer: Self-pay | Admitting: Obstetrics
